# Patient Record
Sex: Female | Born: 1975
Health system: Southern US, Community
[De-identification: ages and names within clinical notes are randomized; demographics above are authoritative.]

## PROBLEM LIST (undated history)

## (undated) DIAGNOSIS — E785 Hyperlipidemia, unspecified: Secondary | ICD-10-CM

## (undated) DIAGNOSIS — I10 Essential (primary) hypertension: Secondary | ICD-10-CM

## (undated) HISTORY — DX: Hyperlipidemia, unspecified: E78.5

## (undated) HISTORY — DX: Essential (primary) hypertension: I10

---

## 2005-12-22 HISTORY — PX: REDUCTION MAMMAPLASTY: SUR839

## 2009-03-19 ENCOUNTER — Ambulatory Visit: Payer: Self-pay | Admitting: Family Medicine

## 2009-03-19 DIAGNOSIS — I10 Essential (primary) hypertension: Secondary | ICD-10-CM | POA: Insufficient documentation

## 2009-03-19 DIAGNOSIS — E7800 Pure hypercholesterolemia, unspecified: Secondary | ICD-10-CM | POA: Insufficient documentation

## 2009-03-19 DIAGNOSIS — G43909 Migraine, unspecified, not intractable, without status migrainosus: Secondary | ICD-10-CM | POA: Insufficient documentation

## 2009-09-17 ENCOUNTER — Ambulatory Visit: Payer: Self-pay | Admitting: Family Medicine

## 2009-09-17 DIAGNOSIS — F411 Generalized anxiety disorder: Secondary | ICD-10-CM | POA: Insufficient documentation

## 2009-09-18 ENCOUNTER — Encounter: Payer: Self-pay | Admitting: Family Medicine

## 2009-10-25 ENCOUNTER — Ambulatory Visit: Payer: Self-pay | Admitting: Family Medicine

## 2009-10-25 LAB — CONVERTED CEMR LAB
Cholesterol: 227 mg/dL — ABNORMAL HIGH (ref 0–200)
LDL Cholesterol: 147 mg/dL — ABNORMAL HIGH (ref 0–99)
VLDL: 18 mg/dL (ref 0–40)

## 2009-10-26 ENCOUNTER — Encounter: Payer: Self-pay | Admitting: Family Medicine

## 2009-10-26 LAB — CONVERTED CEMR LAB
BUN: 11 mg/dL (ref 6–23)
Chloride: 108 meq/L (ref 96–112)
Potassium: 4.1 meq/L (ref 3.5–5.3)

## 2009-10-29 ENCOUNTER — Encounter: Payer: Self-pay | Admitting: Family Medicine

## 2009-11-09 ENCOUNTER — Encounter: Payer: Self-pay | Admitting: Family Medicine

## 2009-11-29 ENCOUNTER — Encounter: Payer: Self-pay | Admitting: Family Medicine

## 2009-11-29 ENCOUNTER — Ambulatory Visit: Payer: Self-pay | Admitting: Family Medicine

## 2009-11-29 LAB — CONVERTED CEMR LAB
BUN: 10 mg/dL (ref 6–23)
Potassium: 3.6 meq/L (ref 3.5–5.3)
Sodium: 139 meq/L (ref 135–145)

## 2009-12-27 ENCOUNTER — Encounter: Payer: Self-pay | Admitting: Family Medicine

## 2010-02-06 ENCOUNTER — Encounter: Payer: Self-pay | Admitting: Family Medicine

## 2010-02-08 ENCOUNTER — Ambulatory Visit: Payer: Self-pay | Admitting: Family Medicine

## 2010-02-08 ENCOUNTER — Encounter: Payer: Self-pay | Admitting: Family Medicine

## 2010-02-08 LAB — CONVERTED CEMR LAB
CO2: 22 meq/L (ref 19–32)
Glucose, Bld: 80 mg/dL (ref 70–99)
Potassium: 3.7 meq/L (ref 3.5–5.3)
Sodium: 142 meq/L (ref 135–145)

## 2010-03-14 ENCOUNTER — Encounter: Payer: Self-pay | Admitting: Family Medicine

## 2010-05-30 ENCOUNTER — Emergency Department (HOSPITAL_COMMUNITY): Admission: EM | Admit: 2010-05-30 | Discharge: 2010-05-30 | Payer: Self-pay | Admitting: Family Medicine

## 2010-05-31 ENCOUNTER — Encounter: Payer: Self-pay | Admitting: Family Medicine

## 2010-06-10 ENCOUNTER — Ambulatory Visit: Payer: Self-pay | Admitting: Family Medicine

## 2010-06-10 DIAGNOSIS — R5383 Other fatigue: Secondary | ICD-10-CM

## 2010-06-10 DIAGNOSIS — R635 Abnormal weight gain: Secondary | ICD-10-CM | POA: Insufficient documentation

## 2010-06-10 DIAGNOSIS — R5381 Other malaise: Secondary | ICD-10-CM | POA: Insufficient documentation

## 2010-06-10 LAB — CONVERTED CEMR LAB
AST: 19 units/L (ref 0–37)
BUN: 16 mg/dL (ref 6–23)
CO2: 23 meq/L (ref 19–32)
Calcium: 9.5 mg/dL (ref 8.4–10.5)
Chloride: 104 meq/L (ref 96–112)
Creatinine, Ser: 0.98 mg/dL (ref 0.40–1.20)
Free T4: 1.09 ng/dL (ref 0.80–1.80)
Glucose, Bld: 93 mg/dL (ref 70–99)
HCT: 36.5 % (ref 36.0–46.0)
MCV: 77.2 fL — ABNORMAL LOW (ref 78.0–100.0)
Platelets: 312 10*3/uL (ref 150–400)
T3, Free: 2.4 pg/mL (ref 2.3–4.2)
TSH: 2.148 microintl units/mL (ref 0.350–4.500)
WBC: 9.1 10*3/uL (ref 4.0–10.5)

## 2010-06-13 ENCOUNTER — Encounter: Payer: Self-pay | Admitting: Family Medicine

## 2010-06-19 ENCOUNTER — Encounter: Payer: Self-pay | Admitting: Family Medicine

## 2010-06-19 ENCOUNTER — Ambulatory Visit: Payer: Self-pay | Admitting: Family Medicine

## 2010-06-19 LAB — CONVERTED CEMR LAB
Calcium: 9.7 mg/dL (ref 8.4–10.5)
Cholesterol: 230 mg/dL — ABNORMAL HIGH (ref 0–200)
HDL: 59 mg/dL (ref >39)
Sodium: 140 meq/L (ref 135–145)
Total CHOL/HDL Ratio: 3.9
Triglycerides: 100 mg/dL (ref <150)

## 2010-07-24 ENCOUNTER — Encounter: Payer: Self-pay | Admitting: Family Medicine

## 2010-10-29 ENCOUNTER — Encounter: Payer: Self-pay | Admitting: Family Medicine

## 2010-12-24 ENCOUNTER — Ambulatory Visit: Admission: RE | Admit: 2010-12-24 | Discharge: 2010-12-24 | Payer: Self-pay | Source: Home / Self Care

## 2011-01-21 NOTE — Miscellaneous (Signed)
Summary: HCTZ refill and dose correction  Clinical Lists Changes Received refill request via fax for HCTZ 12.5 ONCE a day.  Med list shows 2 tabs daily but last e refill was for once daily.  I corrected med list to reflect the dose she is taking. Medications: Changed medication from HYDROCHLOROTHIAZIDE 12.5 MG CAPS (HYDROCHLOROTHIAZIDE) 2 daily for blood pressure to HYDROCHLOROTHIAZIDE 12.5 MG CAPS (HYDROCHLOROTHIAZIDE) 1 daily for blood pressure - Signed Rx of HYDROCHLOROTHIAZIDE 12.5 MG CAPS (HYDROCHLOROTHIAZIDE) 1 daily for blood pressure;  #90 x 3;  Signed;  Entered by: Doralee Albino MD;  Authorized by: Doralee Albino MD;  Method used: Electronically to [facility]*, [address] Shipping/mailing, [place], [place]  [address], Ph: [phone], Fax: [phone]    Prescriptions: HYDROCHLOROTHIAZIDE 12.5 MG CAPS (HYDROCHLOROTHIAZIDE) 1 daily for blood pressure  #90 x 3   Entered and Authorized by:   Doralee Albino MD   Signed by:   Doralee Albino MD on 07/24/2010   Method used:   Electronically to        [facility]* (retail)       [address]       [address] Shipping/mailing       [place], [place]  [address]       Ph: [phone]       Fax: [phone]   RxID:   [id number]   Appended Document: HCTZ refill and dose correction Change that.  Spoke to patient and she takes 25 mg daily.  OK to use tabs.  New Rx sent.  Doralee Albino MD  July 24, 2010 12:16 PM    Clinical Lists Changes  Medications: Changed medication from HYDROCHLOROTHIAZIDE 12.5 MG CAPS (HYDROCHLOROTHIAZIDE) 1 daily for blood pressure to HYDROCHLOROTHIAZIDE 25 MG  TABS (HYDROCHLOROTHIAZIDE) Take 1 tab by mouth every morning - Signed Rx of HYDROCHLOROTHIAZIDE 25 MG  TABS (HYDROCHLOROTHIAZIDE) Take 1 tab by mouth every morning;  #90 x 3;  Signed;  Entered by: Doralee Albino MD;  Authorized by: Doralee Albino MD;  Method used: Electronically to [facility]*, [address] Shipping/mailing, [place], [place]  [address], Ph: [phone], Fax: [phone]    Prescriptions: HYDROCHLOROTHIAZIDE 25 MG  TABS (HYDROCHLOROTHIAZIDE) Take 1 tab by mouth every morning  #90 x 3   Entered and Authorized by:   Doralee Albino MD   Signed by:   Doralee Albino MD on 07/24/2010   Method used:   Electronically to        [facility]* (retail)       [address]       [address] Shipping/mailing       [place], [place]  [address]       Ph: [phone]       Fax: [phone]   RxID:   [id number]

## 2011-01-21 NOTE — Miscellaneous (Signed)
  Clinical Lists Changes  Medications: Added new medication of TESSALON PERLES 100 MG  CAPS (BENZONATATE) 1 by mouth at bedtime for cough - Signed Rx of TESSALON PERLES 100 MG  CAPS (BENZONATATE) 1 by mouth at bedtime for cough;  #15 x 0;  Signed;  Entered by: Pearlean Brownie MD;  Authorized by: Pearlean Brownie MD;  Method used: Electronically to [facility]*, [address] Shipping/mailing, [place], [place]  [address], Ph: [phone], Fax: [phone] <no value>    Prescriptions: TESSALON PERLES 100 MG  CAPS (BENZONATATE) 1 by mouth at bedtime for cough  #15 x 0   Entered and Authorized by:   Pearlean Brownie MD   Signed by:   Pearlean Brownie MD on 06/13/2010   Method used:   Electronically to        [facility]* (retail)       [address]       [address] Shipping/mailing       [place], [place]  [address]       Ph: [phone]       Fax: [phone]   RxID:   [id number]

## 2011-01-21 NOTE — Miscellaneous (Signed)
Summary: Change from setraline to citalopram  Medications: Removed medication of ZOLOFT 50 MG TABS (SERTRALINE HCL) 1 by mouth in the AM Added new medication of CITALOPRAM HYDROBROMIDE 20 MG TABS (CITALOPRAM HYDROBROMIDE) 1 daily - Signed Rx of CITALOPRAM HYDROBROMIDE 20 MG TABS (CITALOPRAM HYDROBROMIDE) 1 daily;  #30 x 3;  Signed;  Entered by: Pearlean Brownie MD;  Authorized by: Pearlean Brownie MD;  Method used: Electronically to [facility]*, [address] Shipping/mailing, [place], [place]  [address], Ph: [phone], Fax: [phone] Clinical Lists Changes  Medications: Removed medication of ZOLOFT 50 MG TABS (SERTRALINE HCL) 1 by mouth in the AM Added new medication of CITALOPRAM HYDROBROMIDE 20 MG TABS (CITALOPRAM HYDROBROMIDE) 1 daily Rx of CITALOPRAM HYDROBROMIDE 20 MG TABS (CITALOPRAM HYDROBROMIDE) 1 daily;  #30 x 3;  Unsigned;  Entered by: Pearlean Brownie MD;  Authorized by: Pearlean Brownie MD;  Method used: Electronically to [facility]*, [address] Shipping/mailing, [place], [place]  [address], Ph: [phone], Fax: [phone]  I sent it in.  It gave a warning about reactions to folks with red dye allergy which was triggered by your prior allergy to Amoxicillin.  I think its fine to take as long as you aren't allergic to red dye  Let me know if any problems  Linda Gordon  From: Linda Gordon  Sent: Thursday, March 14, 2010 9:23 AM To: Linda Gordon Subject: RE: Question about medication  I would greatly appreciate it. Thank you so much!!!   Patina  ________________________________________ From: Linda Gordon Sent: Wed 03/13/2010 3:16 PM To: Linda Gordon Subject: RE: Question about medication  I think citalopram would be a fine choice.  We'll just hope for you the yawning is only with Sertraline.   Would you like me to send in Citalopram?   I can do it in the AM as I'm out of the  office right now Nedra Hai  -----Original Message-----  From: Linda Gordon  Sent: Wed 03/13/2010 2:40 PM  To: Linda Gordon  Subject: RE: Question about medication     Hi Dr Deirdre Priest,     I would be the one who would experience this. I have tried Paxil in the past and it did not work for me at all. I don't think I have issues with anxiety as much as I did when I came in to visit you. I'm more concerned about my mood which a lot of times before I started Sertraline was very bad. I know the incidence of Citalopram causing yawning is also 1%...what do you think about that medication? Thank you so much for taking the time to respond!  Linda Gordon  ________________________________  From: Linda Gordon  Sent: Wed 03/13/2010 9:00 AM  To: Linda Gordon  Subject: RE: Question about medication   I have not had pts complain of yawning with sertraline but looked it up and it certainly has been reported.  Paroxetine (Paxil) is also indicated for anxiety and would be my recommendation as an alternative.  If the yawning does not improve we could change to a completely different class likes SNRIs but these have less experience regarding anxiety treatment. Would you like me to send in Paxil?  Linda Gordon   From: Linda Gordon  Sent: Tuesday, March 12, 2010 10:39 AM  To: Linda Gordon  Subject: Question about medication  Hello Dr Deirdre Priest,  I hope all is well and you are having a great week. I have a question for  you concerning my Sertraline. I am experiencing excessive yawning to the point of annoyance. I can be at the gym and I'm yawning and it's not one here and there they are pretty much back to back. I get plenty of rest because I haven't had issues with sleeping since I started working out. I looked up information on excessive yawning and realized that my medication could be the culprit. I still fell that I need to be on something and was inquiring if there are any alternatives.  Thanks in advance,   Linda Gordon  [phone]  Prescriptions: CITALOPRAM HYDROBROMIDE 20 MG TABS (CITALOPRAM HYDROBROMIDE) 1 daily  #30 x 3   Entered and Authorized by:   Pearlean Brownie MD   Signed by:   Pearlean Brownie MD on 03/14/2010   Method used:   Electronically to        [facility]* (retail)       [address]       [address] Shipping/mailing       [place], [place]  [address]       Ph: [phone]       Fax: [phone]   RxID:   [id number]

## 2011-01-21 NOTE — Miscellaneous (Signed)
Summary: email message  Clinical Lists Changes  Medications: Changed medication from ENALAPRIL MALEATE 20 MG TABS (ENALAPRIL MALEATE) 1 by mouth daily for blood pressure to ENALAPRIL MALEATE 20 MG TABS (ENALAPRIL MALEATE) 1 by mouth twice daily for blood pressure I did have a good holiday season.I hope your [place] is off to a good start other thanthe BP.  Usually would go up to 25 mg on your HCTZ but with your history of low potassium I think it would be better to go up to 40 mg a day of enalapril.  You can take 2 pills at once or twice a day which might be a little better but harder to remember.Let me know how your bp is doing over the next week or so.We will need to check your bmet in a few weeks of the new dose.  Call if any questions  LC  From: [name] Sent: Thursday, December 27, 2009 12:48 PMTo: Deirdre Priest, LeeSubject: Blood pressure  Hello and Happy New Year,  I'm sending this in regards to my blood pressure since the addition of HCTZ. My bp was good in the beginning but in the last couple of weeks it's back to where it was before we added the new medication. It is now running anywhere from 140/90 to 154/106. At this point I don't know if you want to increase the Enalapril or change my medication altogether. If you could please give me your thoughts on this I'm just really concerned because I am on birth control pills.  Thanks and have a great day,  [employer] [phone]

## 2011-01-21 NOTE — Miscellaneous (Signed)
Summary: went to ED  Clinical Lists Changes went to ED for cough. dx bronchitis...no answer at home phone. called at work #. She is off until monday. will try monday & offer f/u appt.Golden Circle RN  May 31, 2010 8:45 AM

## 2011-01-21 NOTE — Miscellaneous (Signed)
  Clinical Lists Changes  Orders: Added new Test order of Basic Met-[facility] ([id number]) - Signed 

## 2011-01-21 NOTE — Miscellaneous (Signed)
Summary: Email  Clinical Lists Changes Hi Dr. Deirdre Priest,   I hope all has been well with you. I am emailing you because Linda Gordon has caused me to be concerned about taking my ace inhibitor. I have been experiencing a hacking cough off and on for about 6 months. Most of the time I experience the cough without any other symptoms involved. I was just wondering what your thoughts were and if you think changing my medication is a good idea. If you do decide to change my medication can we please stick to a medication that is generically available.   Thanks,   Linda Gordon [phone]   Sorry for the delay.  Certainly the acei could cause a cough.  We can change to losartan and see if it goes away.   I'll send a Rx to the [facility] pharmacy.  Monitor your bp and let me know how it goes  [employer]  Medications: Changed medication from ENALAPRIL MALEATE 20 MG TABS (ENALAPRIL MALEATE) 1 by mouth twice daily for blood pressure to LOSARTAN POTASSIUM 50 MG TABS (LOSARTAN POTASSIUM) 1 daily for blood pressure - Signed Rx of LOSARTAN POTASSIUM 50 MG TABS (LOSARTAN POTASSIUM) 1 daily for blood pressure;  #30 x 1;  Signed;  Entered by: Pearlean Brownie MD;  Authorized by: Pearlean Brownie MD;  Method used: Electronically to [facility]*, [address] Shipping/mailing, [place], [place]  [address], Ph: [phone], Fax: [phone]    [Prescriptions]

## 2011-01-21 NOTE — Assessment & Plan Note (Signed)
Summary: cpe,df   Vital Signs:  Patient profile:   35 year old female Height:      65 inches Weight:      199.8 pounds BMI:     33.37 Temp:     98.2 degrees F oral Pulse rate:   74 / minute BP sitting:   120 / 84  (left arm) Cuff size:   regular  Vitals Entered By: Garen Grams LPN (June 10, 2010 4:01 PM)  Serial Vital Signs/Assessments:  Time      Position  BP       Pulse  Resp  Temp     By 4:55pm              134/88                         Terese Door  CC: cpe Is Patient Diabetic? No Pain Assessment Patient in pain? no        CC:  cpe.  History of Present Illness: Fatigue feels tired most of the time even though exercising and eating well.  Discouraged about not losing any weight.  No fever or chills or hair changes or productive cough or chest pain or shortness of breath or edema  Cough post brochitis dry cough esp at night  Anxiety - doing well with celexa  HYPERTENSION Disease Monitoring   Blood pressure range:higher at home 140s/90s       Chest pain: N     Dyspnea:N Medications   Compliance: daily   Lightheadedness: N     Edema:N Prevention   Exercise: yes 5 days a week    Salt restriction:Yes  ROS - as above PMH - Medications reviewed and updated in medication list.  Smoking Status noted in VS form      Habits & Providers  Alcohol-Tobacco-Diet     Tobacco Status: never  Current Medications (verified): 1)  Enalapril Maleate 20 Mg Tabs (Enalapril Maleate) .Marland Kitchen.. 1 By Mouth Twice Daily For Blood Pressure 2)  Lutera 0.1-20 Mg-Mcg Tabs (Levonorgestrel-Ethinyl Estrad) .Marland Kitchen.. 1 Tablet By Mouth Daily 3)  Pravachol 20 Mg Tabs (Pravastatin Sodium) .Marland Kitchen.. 1 Daily 4)  Relpax 40 Mg Tabs (Eletriptan Hydrobromide) .... As Directed For Migraine Headache 5)  Hydrochlorothiazide 12.5 Mg Caps (Hydrochlorothiazide) .... 2 Daily For Blood Pressure 6)  Citalopram Hydrobromide 20 Mg Tabs (Citalopram Hydrobromide) .Marland Kitchen.. 1 Daily  Allergies: 1)  ! Penicillin 2)  !  Sulfa 3)  ! Flagyl 4)  ! Fluconazole (Fluconazole) 5)  ! Amoxicillin  Physical Exam  General:  Well-developed,well-nourished,in no acute distress; alert,appropriate and cooperative throughout examination Neck:  No deformities, masses, or tenderness noted. Lungs:  Normal respiratory effort, chest expands symmetrically. Lungs are clear to auscultation, no crackles or wheezes. Heart:  Normal rate and regular rhythm. S1 and S2 normal without gallop, murmur, click, rub or other extra sounds. Abdomen:  Bowel sounds positive,abdomen soft and non-tender without masses, organomegaly or hernias noted. Msk:  No deformity or scoliosis noted of thoracic or lumbar spine.   Extremities:  No clubbing, cyanosis, edema, or deformity noted with normal full range of motion of all joints.     Impression & Recommendations:  Problem # 1:  FATIGUE (ICD-780.79) may be more discouragement because not losing weight despite significant exercise and diet changes.  Does not appear to be depression or any cancer or inflamatory disease.  Will check labs  Orders: CBC-[facility] ([address]) TSH-[facility] ([id number]) Comp Met-[facility] [phone]) Sed Rate (ESR)-[facility] (  [address]) Free T3-[facility] [phone]) Free T4-[facility] [phone])  Problem # 2:  HYPERTENSION (ICD-401.9) Not controlled by home readings.  Increase hctz and watch her k closely  Her updated medication list for this problem includes:    Enalapril Maleate 20 Mg Tabs (Enalapril maleate) .Marland Kitchen... 1 by mouth twice daily for blood pressure    Hydrochlorothiazide 12.5 Mg Caps (Hydrochlorothiazide) .Marland Kitchen... 2 daily for blood pressure  Future Orders: Basic Met-[facility] ([id number]) ... 05/29/2011  Problem # 3:  HYPERCHOLESTEROLEMIA (ICD-272.0) check fastling  Her updated medication list for this problem includes:    Pravachol 20 Mg Tabs (Pravastatin sodium) .Marland Kitchen... 1 daily  Future Orders: Lipid-[facility] ([id number]) ... 05/29/2011  Problem # 4:  Preventive Health Care (ICD-V70.0) Up to Date.   Sees Dr Renaldo Fiddler for paps   Complete Medication List: 1)  Enalapril Maleate 20 Mg Tabs (Enalapril maleate) .Marland Kitchen.. 1 by mouth twice daily for blood pressure 2)  Lutera 0.1-20 Mg-mcg Tabs (Levonorgestrel-ethinyl estrad) .Marland Kitchen.. 1 tablet by mouth daily 3)  Pravachol 20 Mg Tabs (Pravastatin sodium) .Marland Kitchen.. 1 daily 4)  Relpax 40 Mg Tabs (Eletriptan hydrobromide) .... As directed for migraine headache 5)  Hydrochlorothiazide 12.5 Mg Caps (Hydrochlorothiazide) .... 2 daily for blood pressure 6)  Citalopram Hydrobromide 20 Mg Tabs (Citalopram hydrobromide) .Marland Kitchen.. 1 daily  Other Orders: [facility] - Est  18-39 yrs [id number])  Patient Instructions: 1)  Increase your HCTZ to 2 daily 2)  Follow your blood pressure and right down your readings 3)  I will call you if your lab is abnormal otherwise I will send you a email within 2 weeks. 4)  Come in for a fasting blood test in one week Prescriptions: CITALOPRAM HYDROBROMIDE 20 MG TABS (CITALOPRAM HYDROBROMIDE) 1 daily  #90 x 3   Entered and Authorized by:   Pearlean Brownie MD   Signed by:   Pearlean Brownie MD on 06/10/2010   Method used:   Electronically to        [facility]* (retail)       [address]       [address] Shipping/mailing       [place], [place]  [address]       Ph: [phone]       Fax: [phone]   RxID:   [id number] PRAVACHOL 20 MG TABS (PRAVASTATIN SODIUM) 1 daily  #90 x 3   Entered and Authorized by:   Pearlean Brownie MD   Signed by:   Pearlean Brownie MD on 06/10/2010   Method used:   Electronically to        [facility]* (retail)       [address]       [address] Shipping/mailing       [place], [place]  [address]       Ph: [phone]       Fax: [phone]   RxID:   [id number]   Prevention & Chronic Care Immunizations   Influenza vaccine: Historical  (09/21/2009)    Tetanus booster: Not documented    Pneumococcal vaccine: Not documented  Other Screening    Pap smear: Dr Vickey Sages nl   (10/18/2009)   Smoking status: never  (06/10/2010)  Lipids   Total Cholesterol: 227  (10/25/2009)   LDL: 147  (10/25/2009)   LDL Direct: Not documented   HDL: 62  (10/25/2009)   Triglycerides: 91  (10/25/2009)    SGOT (AST): 15  (03/01/2009)   SGPT (ALT): 10  (03/01/2009) CMP ordered  Alkaline phosphatase: 70  (03/01/2009)   Total bilirubin: 0.5  (03/01/2009)  Hypertension   Last Blood Pressure: 120 / 84  (06/10/2010)   Serum creatinine: 0.80  (02/08/2010)   Serum potassium 3.7  (02/08/2010) CMP ordered   Self-Management Support :   Personal Goals (by the next clinic visit) :      Personal blood pressure goal: 140/90  (10/25/2009)     Personal LDL goal: 130  (10/25/2009)    Hypertension self-management support: BP self-monitoring log, Written self-care plan  (10/25/2009)    Lipid self-management support: Not documented

## 2011-01-23 NOTE — Assessment & Plan Note (Signed)
Summary: new pt/nutrition consult/bmc   Vital Signs:  Patient profile:   35 year old female Height:      64 inches Weight:      202.3 pounds BMI:     34.85  Vitals Entered By: Wyona Almas PHD (December 24, 2010 4:00 PM)  History of Present Illness: Assessment:  Spent 60 min w/ pt.  Usual eating pattern includes 3 meals and 2 snacks/day.  Everyday foods/beverages include 2 boiled eggs w/ white toast & apple butter or cooked oatmeal, 1 c juice.  Usual exercise routine includes 45 min cardio, plus wts at the gym 3-4 X wk.   24-hr recall was not a typical day: B (11 AM)- 2 scrmbld eggs w/ 1 slc Amer chs, 2 slc white toast w/ 2 tsp apple butter, 3 strips bacon, 1 c green tea gingerale; Snk- water; D (6:45 PM)- broccoli, 4 oz crockpot chx breast (crm of mushrm soup), 1 c pasta, 1 cc cookie.  A typical work day is boiled egg/oatmeal for B; L is frozen meal, sometimes a salad too or fruit; D is usually chx, veg, starch.  BP has been med-controlled for 11 yrs, since pre-eclampsia w/ her daughter.    Nutrition Diagnosis:  Physical inactivity (NB-2.1) related to time constraints as evidenced by exercise 3-4 X wk.  Inappropriate intake of types of carbohydrate (NI-53.3) related to veg's as evidenced by 1 serving of veg's limited to 1-2 X day.    Intervention: See Patient Instructions.    Monitoring/Eval:  Dietary intake, body weight, and exercise at 3-4-wk F/U.    Allergies: 1)  ! Penicillin 2)  ! Sulfa 3)  ! Flagyl 4)  ! Fluconazole (Fluconazole) 5)  ! Amoxicillin   Complete Medication List: 1)  Losartan Potassium 50 Mg Tabs (Losartan potassium) .Marland Kitchen.. 1 daily for blood pressure 2)  Lutera 0.1-20 Mg-mcg Tabs (Levonorgestrel-ethinyl estrad) .Marland Kitchen.. 1 tablet by mouth daily 3)  Pravachol 20 Mg Tabs (Pravastatin sodium) .Marland Kitchen.. 1 daily 4)  Relpax 40 Mg Tabs (Eletriptan hydrobromide) .... As directed for migraine headache 5)  Hydrochlorothiazide 25 Mg Tabs (Hydrochlorothiazide) .... Take 1 tab by mouth  every morning 6)  Citalopram Hydrobromide 20 Mg Tabs (Citalopram hydrobromide) .Marland Kitchen.. 1 daily 7)  Tessalon Perles 100 Mg Caps (Benzonatate) .Marland Kitchen.. 1 by mouth at bedtime for cough  Other Orders: Inital Assessment Each - [facility] ([address])  Patient Instructions: 1)  No more than 5 hours between eating.  2)  Obtain twice as many veg's as protein or carbohydrate foods for both lunch and dinner.  3)  Physical activity: Continue gym 3-4 X wk.  Add to this some exercises at home as possible AND exercise opportunities each day, i.e., stairs vs. elevator, park far away.   4)  Make a list of ways you can increase your day-to-day activity.   5)  Experiment with NO juice in the mornings.  Track the time at which you first notice hunger for 1-2 wks.   6)  Avoid other simple carb's before noon.  Eat your fruit instead of drinking it.   7)  Follow-up appt:  Please schedule an appt in Feb.     Orders Added: 1)  Inital Assessment Each - [facility] [[id number]]

## 2011-02-14 ENCOUNTER — Ambulatory Visit: Payer: Self-pay

## 2011-02-14 ENCOUNTER — Other Ambulatory Visit: Payer: Self-pay | Admitting: Occupational Medicine

## 2011-02-14 DIAGNOSIS — R7611 Nonspecific reaction to tuberculin skin test without active tuberculosis: Secondary | ICD-10-CM

## 2011-02-21 ENCOUNTER — Other Ambulatory Visit: Payer: Self-pay | Admitting: Family Medicine

## 2011-02-21 DIAGNOSIS — R7611 Nonspecific reaction to tuberculin skin test without active tuberculosis: Secondary | ICD-10-CM

## 2011-02-21 NOTE — Assessment & Plan Note (Signed)
No signs of TB.  Will refer to ID for further investigation or testing as needed

## 2011-02-26 ENCOUNTER — Telehealth: Payer: Self-pay | Admitting: Family Medicine

## 2011-02-26 NOTE — Telephone Encounter (Signed)
Phone note complete.  See work queue for details. Linda Gordon, Linda Gordon

## 2011-02-26 NOTE — Telephone Encounter (Signed)
Pt is asking about referral to specialist - states that someone called yesterday, but didn't leave a message.

## 2011-03-05 ENCOUNTER — Ambulatory Visit (INDEPENDENT_AMBULATORY_CARE_PROVIDER_SITE_OTHER): Payer: Self-pay | Admitting: Family Medicine

## 2011-03-05 VITALS — BP 141/94 | HR 83 | Temp 98.3°F | Ht 64.0 in | Wt 196.3 lb

## 2011-03-05 DIAGNOSIS — L089 Local infection of the skin and subcutaneous tissue, unspecified: Secondary | ICD-10-CM

## 2011-03-05 MED ORDER — DOXYCYCLINE HYCLATE 100 MG PO TABS: 100.0000 mg | ORAL_TABLET | Freq: Two times a day (BID) | ORAL | Status: AC

## 2011-03-05 NOTE — Assessment & Plan Note (Signed)
  Pustule suspicious for MRSA infection. Will culture and start on Doxycycline x 10 days.  Will call patient with results.

## 2011-03-05 NOTE — Patient Instructions (Addendum)
We are testing your wound for MRSA with a wound culture.  I will have my staff call you with results.  Start Doxycycline and finish the full 10 days.  Look into Hibiclens baths if you develop more of these and this is MRSA.  You can use Bacitracin on the skin.  You can also look up charcoal compresses on the Internet as a way to treat the wound directly.

## 2011-03-05 NOTE — Progress Notes (Signed)
Skin Pustule: Pt has a Rt lower leg that has a pustule on the lateral portion of her calf. She started feeling it on Sat and says it has gradually worsened over the last 4 days. She says it does not itch like her usual bug bites do. She has no burning but does have pain when she is walking. She went over to the sports medicine clinic to ask them if she should be evaluated and she was told to come over here. She has a prior history of getting lots of bug bites but no MRSA infections or boils. She was running on Saturday and isn't sure if she got bit or scratched by something.   ROS:  No fevers or chills, some pain and erythema around the wound site. Otherwise neg exam.   PE: GEN: sitting comfortably in chair.  Ext: Pt has a area of erythema measuring 2-2.5 cm in diameter. She has a small 4 mm pustule in the center of the erythema. Very little induration. Warmth is present, minor swelling present as well.

## 2011-03-06 ENCOUNTER — Encounter: Payer: Self-pay | Admitting: Family Medicine

## 2011-03-08 LAB — WOUND CULTURE: Gram Stain: NONE SEEN

## 2011-03-11 ENCOUNTER — Telehealth: Payer: Self-pay | Admitting: Family Medicine

## 2011-03-11 NOTE — Telephone Encounter (Signed)
Pt wants to know if results have come back for MRSA?

## 2011-03-12 NOTE — Telephone Encounter (Signed)
Called and informed pt of neg lab and told her to continue taking the abx.Linda Gordon [place]

## 2011-03-24 ENCOUNTER — Encounter: Payer: Self-pay | Admitting: Infectious Diseases

## 2011-03-24 ENCOUNTER — Ambulatory Visit (INDEPENDENT_AMBULATORY_CARE_PROVIDER_SITE_OTHER): Payer: Self-pay | Admitting: Infectious Diseases

## 2011-03-24 DIAGNOSIS — R7611 Nonspecific reaction to tuberculin skin test without active tuberculosis: Secondary | ICD-10-CM

## 2011-03-24 NOTE — Progress Notes (Signed)
  Subjective:    Patient ID: Linda Gordon, female    DOB: 07/07/1976, 35 y.o.   MRN: [id number]  HPI 35 yo F with hx of MSSA skin infection (03-05-11) and recent PPD+. This was followed by a Quantiferon Gold that was positive as well.  She had an erythematous skin reaction each time she had a skin test for the last 3 years but no induration. It was very pruritic and took about 2 weeks to go away. Has had negative CXR at [facility]. Has had yearly HIV-.  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 No family hx.    Review of Systems  Constitutional: Negative for fever, chills and unexpected weight change.  Respiratory: Negative for cough.   Gastrointestinal: Negative for diarrhea and constipation.  Hematological: Negative for adenopathy.       Objective:   Physical Exam  Constitutional: She appears well-developed and well-nourished. No distress.  Eyes: EOM are normal. Pupils are equal, round, and reactive to light.  Neck: Neck supple.  Cardiovascular: Normal rate, regular rhythm and normal heart sounds.   Pulmonary/Chest: Effort normal and breath sounds normal.  Abdominal: Soft. Bowel sounds are normal.  Musculoskeletal:       Right shoulder: She exhibits no swelling.          Assessment & Plan:

## 2011-03-24 NOTE — Assessment & Plan Note (Signed)
Spoke with pt about PPD and quantiferon gold. The sensitivity of this test is ~90%, specificity is higher. Given her history of a positive test and indeterminate PPDs, will refer her to health dept for treatment. We discussed this, that she cannot drink ETOH while on treatment and that HD may in fact retest her PPD and decide not to treat her if they feel it is negative.  I suspect she will need treatment. She will be seen today at 3pm. She will refer to ID clinic prn.

## 2011-03-27 ENCOUNTER — Telehealth: Payer: Self-pay | Admitting: Family Medicine

## 2011-03-27 NOTE — Telephone Encounter (Signed)
Spoke with her.  Basically agreed there were pros and cons to either decision and I feel either course is justifiable, given that her likelihood of this being a false positive is very real since her pretest probability is quite low with no known close exposure.    From: [name]  Sent: Wednesday, March 26, 2011 11:47 AM To: [name] Subject: Hello  Hello Dr. Deirdre Priest,   I was hoping that we could talk when you get a chance. I had my appointment with Dr. Ninetta Lights and he recommends medication. I know that I have the option to not take it and after doing some research of my own and talking to several people I don't think taking medication is what I want to do. I just would like your opinion on it. I will be at work until 5:30 today and off the rest of the week. If possible please give me a call either at work, home, or on my cell.   Thanks in Linda, Gordon [phone] home [phone] cell

## 2011-06-10 ENCOUNTER — Other Ambulatory Visit: Payer: Self-pay | Admitting: Family Medicine

## 2011-06-10 DIAGNOSIS — E7800 Pure hypercholesterolemia, unspecified: Secondary | ICD-10-CM

## 2011-06-10 DIAGNOSIS — I10 Essential (primary) hypertension: Secondary | ICD-10-CM

## 2011-06-10 DIAGNOSIS — R5381 Other malaise: Secondary | ICD-10-CM

## 2011-06-10 DIAGNOSIS — R5383 Other fatigue: Secondary | ICD-10-CM

## 2011-06-19 ENCOUNTER — Other Ambulatory Visit: Payer: Self-pay

## 2011-06-19 DIAGNOSIS — I10 Essential (primary) hypertension: Secondary | ICD-10-CM

## 2011-06-19 DIAGNOSIS — R5381 Other malaise: Secondary | ICD-10-CM

## 2011-06-19 DIAGNOSIS — E7800 Pure hypercholesterolemia, unspecified: Secondary | ICD-10-CM

## 2011-06-19 LAB — COMPREHENSIVE METABOLIC PANEL
ALT: 17 U/L (ref 0–35)
AST: 21 U/L (ref 0–37)
Creat: 0.97 mg/dL (ref 0.50–1.10)
Sodium: 141 mEq/L (ref 135–145)
Total Bilirubin: 0.4 mg/dL (ref 0.3–1.2)
Total Protein: 7.1 g/dL (ref 6.0–8.3)

## 2011-06-19 LAB — LIPID PANEL
LDL Cholesterol: 171 mg/dL — ABNORMAL HIGH (ref 0–99)
Total CHOL/HDL Ratio: 4.2 Ratio
VLDL: 19 mg/dL (ref 0–40)

## 2011-06-19 NOTE — Progress Notes (Signed)
Cbc,flp,vit d and cmp done today Torian Thoennes

## 2011-06-20 LAB — CBC
MCH: 26.9 pg (ref 26.0–34.0)
MCV: 79.5 fL (ref 78.0–100.0)
Platelets: 267 10*3/uL (ref 150–400)
RBC: 4.69 MIL/uL (ref 3.87–5.11)
RDW: 14.8 % (ref 11.5–15.5)
WBC: 5.7 10*3/uL (ref 4.0–10.5)

## 2011-06-30 ENCOUNTER — Encounter: Payer: Self-pay | Admitting: Infectious Diseases

## 2011-06-30 ENCOUNTER — Encounter: Payer: Self-pay | Admitting: Family Medicine

## 2011-06-30 ENCOUNTER — Ambulatory Visit (INDEPENDENT_AMBULATORY_CARE_PROVIDER_SITE_OTHER): Payer: Commercial Managed Care - PPO | Admitting: Family Medicine

## 2011-06-30 DIAGNOSIS — E7800 Pure hypercholesterolemia, unspecified: Secondary | ICD-10-CM

## 2011-06-30 DIAGNOSIS — F411 Generalized anxiety disorder: Secondary | ICD-10-CM

## 2011-06-30 DIAGNOSIS — I10 Essential (primary) hypertension: Secondary | ICD-10-CM

## 2011-06-30 MED ORDER — CITALOPRAM HYDROBROMIDE 20 MG PO TABS: 30.0000 mg | ORAL_TABLET | Freq: Every day | ORAL | Status: DC

## 2011-06-30 MED ORDER — HYDROCHLOROTHIAZIDE 12.5 MG PO CAPS: 12.5000 mg | ORAL_CAPSULE | Freq: Every day | ORAL | Status: DC

## 2011-06-30 NOTE — Assessment & Plan Note (Signed)
Good control. Will decrease her HCTZ and follow closely

## 2011-06-30 NOTE — Patient Instructions (Signed)
Congratulations on all the great changes  Email if your blood pressure is not controlled < 140/90  We should recheck your cholesterol in 1 year

## 2011-06-30 NOTE — Progress Notes (Signed)
  Subjective:    Patient ID: Linda Gordon, female    DOB: 04-04-1976, 35 y.o.   MRN: [id number]  HPI  Feels well   HYPERTENSION Disease Monitoring Blood pressure range-usually in 110/70s at the pharmacy Chest pain- no      Dyspnea- no Medications Compliance- daily Lightheadedness- no   Edema- no  HYPERLIPIDEMIA Symptoms Chest pain on exertion:  no   Leg claudication:   no Medications: Compliance- not taking pravachol for > 6 monthts Right upper quadrant pain- no  Muscle aches- no  Anxiety Is better with her regular exercise.  Increased citalopram to 30 mg on her own.   Weathered her brothers recent death fairly well    Review of Systems  Patient reports no  vision/ hearing changes,anorexia, weight change, fever ,adenopathy, persistant / recurrent hoarseness, swallowing issues, chest pain, edema,persistant / recurrent cough, hemoptysis, dyspnea(rest, exertional, paroxysmal nocturnal), gastrointestinal  bleeding (melena, rectal bleeding), abdominal pain, excessive heart burn, GU symptoms(dysuria, hematuria, pyuria, voiding/incontinence  Issues) syncope, focal weakness, severe memory loss, concerning skin lesions, depression, anxiety, abnormal bruising/bleeding, major joint swelling, .       Objective:   Physical Exam     Heart - Regular rate and rhythm.  No murmurs, gallops or rubs.    Lungs:  Normal respiratory effort, chest expands symmetrically. Lungs are clear to auscultation, no crackles or wheezes. Extremities:  No cyanosis, edema, or deformity noted with good range of motion of all major joints.   Neck:  No deformities, thyromegaly, masses, or tenderness noted.   Supple with full range of motion without pain. Skin:  Intact without suspicious lesions or rashes    Assessment & Plan:

## 2011-06-30 NOTE — Assessment & Plan Note (Signed)
Stable on current dose of celexa and regular vigorous exercise

## 2011-06-30 NOTE — Assessment & Plan Note (Signed)
She is exercising well and eating well.  Discussed pros and cons of statin use.  She would like to follow LDL without pravachol for now.  I think this is a reasonable choice

## 2011-07-08 ENCOUNTER — Telehealth: Payer: Self-pay | Admitting: Family Medicine

## 2011-07-08 NOTE — Telephone Encounter (Signed)
The fluid retention is probably a combination of coming off of the HCTZ and the hot weather.   I would slowly wean off the HCTZ starting with every other day and slowly going down until you can come off all together which might take 1-2 months.  Cut back on salt to a reasonable degree  Good work on the BP!!  Let me know if any problems  From: [name]  Sent: Tuesday, July 08, 2011 1:58 PM To: [name] Subject: Fluid retention  Hello again,   After my appointment the other week I decided to discontinue the HCTZ and monitor my bp since that was a possible option. I'm not completely against taking it daily but my goal is to get completely off of medication. It has actually been really good 120's over 80's range. My only issue is that with me drinking the amount of water that I do I am now retaining fluid. The other day I took my HCTZ and dropped 4 lbs in less than 24 hours. Is there a natural alternative to taking my medication or should I take it a few days a week to control the fluid? Any input would be greatly appreciated!!!!   Thanks,   Linda Gordon   Blood pressure readings   07-02-11 127/88 07-03-11 121/87 07-07-11 119/83 07-08-11 130/82

## 2011-10-20 ENCOUNTER — Other Ambulatory Visit: Payer: Self-pay | Admitting: Family Medicine

## 2011-10-20 MED ORDER — LOSARTAN POTASSIUM 50 MG PO TABS: 50.0000 mg | ORAL_TABLET | Freq: Every day | ORAL | Status: DC

## 2011-12-27 IMAGING — CR DG CHEST 2V
2 series · 2 of 2 positions shown · non-contrast
Comparison: None.

CLINICAL DATA: Positive TB test

CHEST - 2 VIEW

[view not recorded (1 of 2)]
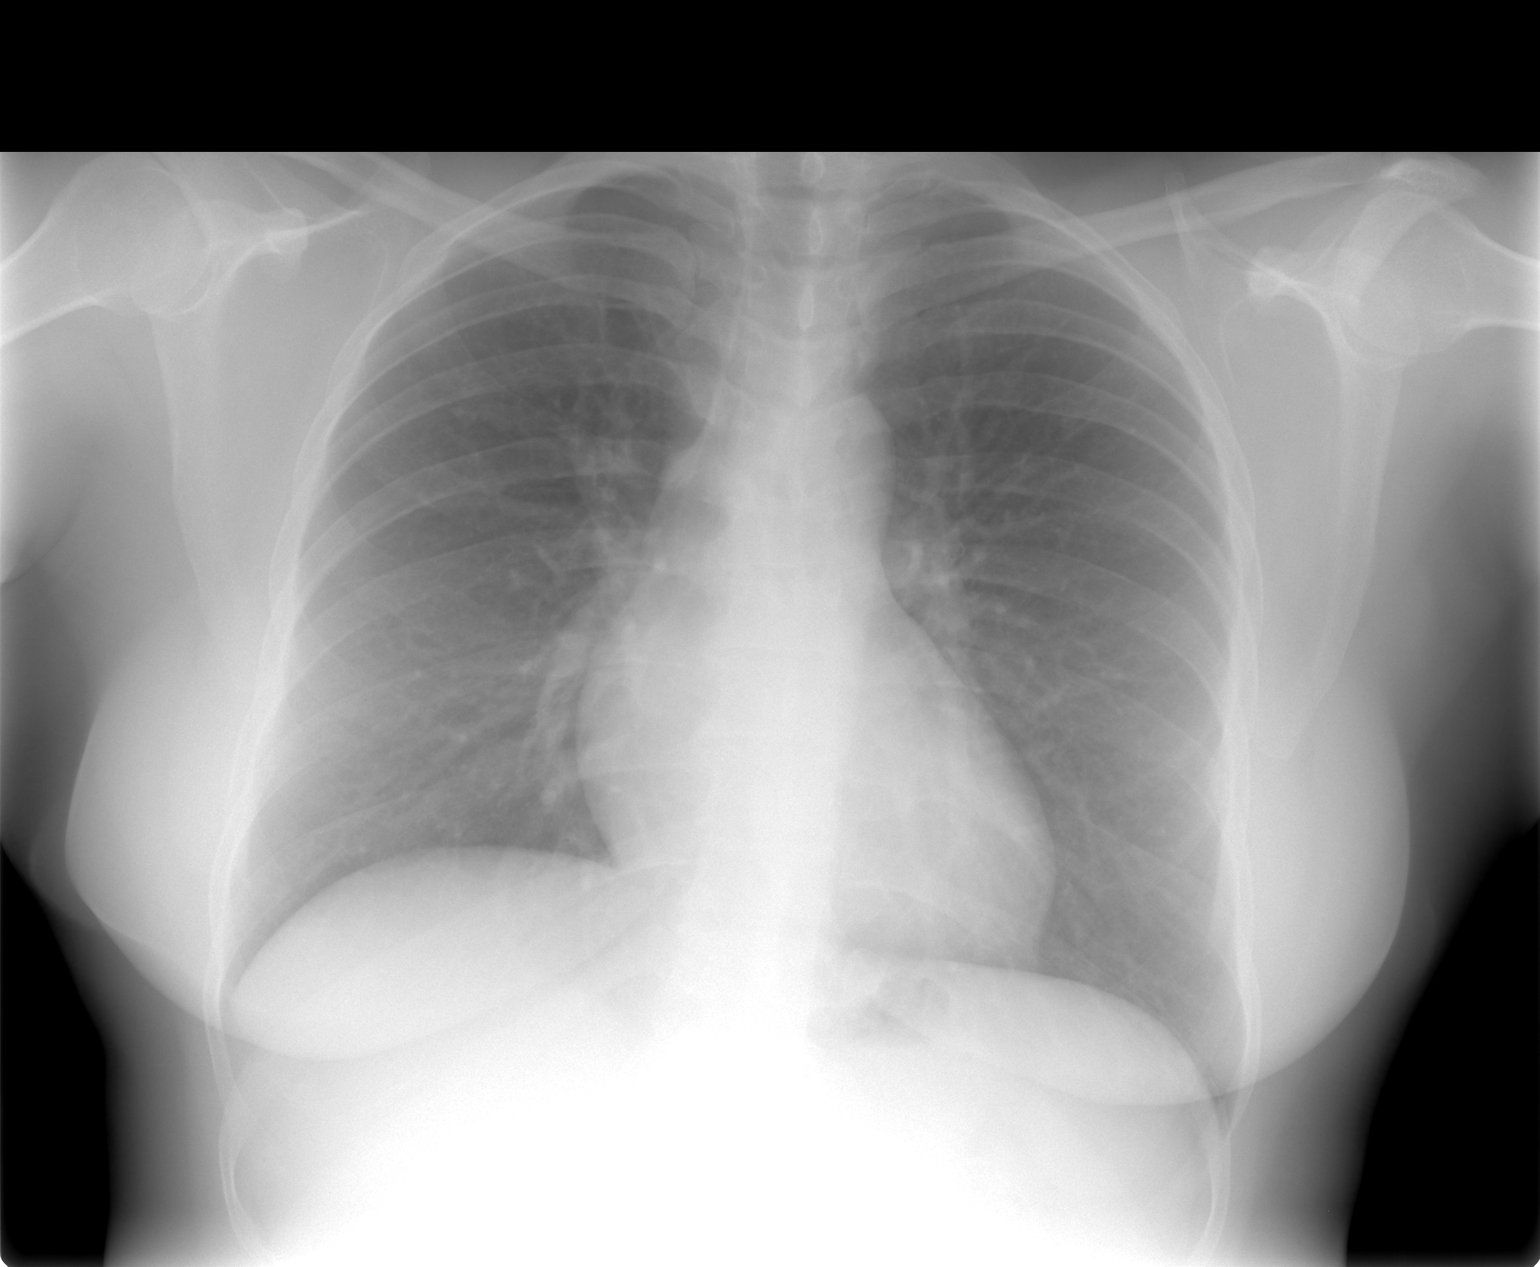

[view not recorded (2 of 2)]
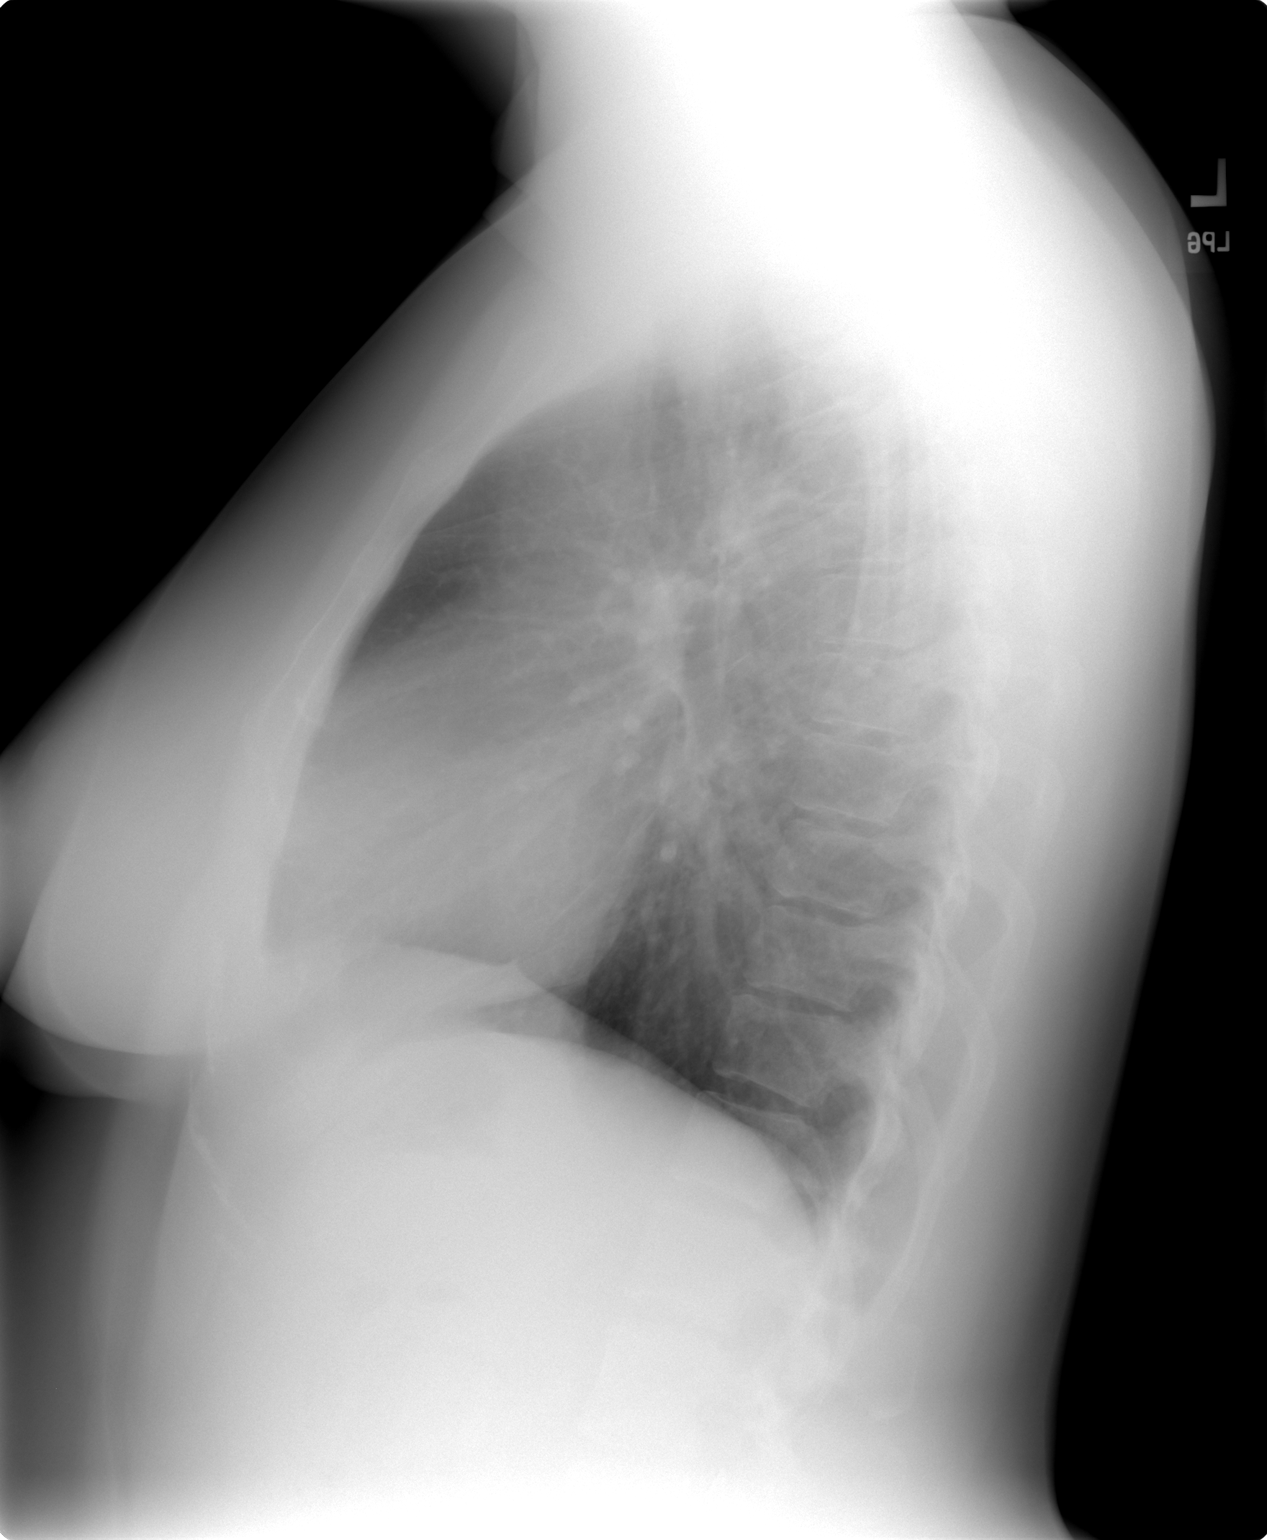

[2 of 2 positions shown; findings below may reference images not displayed]

FINDINGS: The lungs are clear.  Mediastinal contours appear normal.
The heart is within normal limits in size.  No bony abnormality is
seen.
IMPRESSION: No active lung disease.

## 2012-01-16 ENCOUNTER — Telehealth: Payer: Self-pay | Admitting: Family Medicine

## 2012-01-16 DIAGNOSIS — I10 Essential (primary) hypertension: Secondary | ICD-10-CM

## 2012-01-16 NOTE — Telephone Encounter (Signed)
Good questions - yes to both.  I will put in an order for the lab test - come when you want to - call the day before  I can write an Rx for the massage - how should I get it to you?  Congrats on the wt loss and exercsise  Nedra Hai  From: [name]  Sent: Wednesday, January 14, 2012 2:31 PM To: [name] Subject: Questions  Hello Dr. Deirdre Priest,   I hope you are having a great week. I just have 2 questions I would like to ask you. The first is if it's possible to have my potassium checked without having my physical? I don't mind coming in to see you but it hasn't been a year yet. I had to go back to 25 mg of HCTZ because I noticed by bp was elevated when I was only taking 12.5 mg. That could have been because I wasn't working out the same as I had been. I am now working out again and I've lost 7 pounds so far. Along with that I have increased my water intake and I'm drinking about a gallon a day. In the past when I was on Chlorthalidone my potassium was really low and I'm just scared with me drinking so much water that this could happen again.   My second question is if you would be willing to write a prescription for a massage for me (seriously). I have money left over on my [employer] card and massages are covered with a prescription. With the way I'm now working out with weights and the amount of stress I've been under I think this would be great. I'm sure that's not a request you get often. Once again if I need to make an appointment I would be glad to. I apologize for the lengthy email.   Thanks,   [employer]

## 2012-01-21 ENCOUNTER — Other Ambulatory Visit: Payer: 59

## 2012-01-21 DIAGNOSIS — I10 Essential (primary) hypertension: Secondary | ICD-10-CM

## 2012-01-21 DIAGNOSIS — R5381 Other malaise: Secondary | ICD-10-CM

## 2012-01-21 LAB — BASIC METABOLIC PANEL
CO2: 24 mEq/L (ref 19–32)
Chloride: 103 mEq/L (ref 96–112)
Creat: 0.97 mg/dL (ref 0.50–1.10)
Potassium: 3.7 mEq/L (ref 3.5–5.3)
Sodium: 139 mEq/L (ref 135–145)

## 2012-01-21 NOTE — Progress Notes (Signed)
LABS DRAWN FOR BMP AND VITAMIN D CNEWSOME

## 2012-01-21 NOTE — Progress Notes (Signed)
Labs done today Ezra Marquess 

## 2012-01-27 ENCOUNTER — Encounter: Payer: Self-pay | Admitting: Family Medicine

## 2012-01-27 LAB — VITAMIN D 1,25 DIHYDROXY
Vitamin D2 1, 25 (OH)2: <8 pg/mL
Vitamin D3 1, 25 (OH)2: 87 pg/mL

## 2012-03-05 ENCOUNTER — Encounter: Payer: Self-pay | Admitting: Family Medicine

## 2012-03-05 ENCOUNTER — Ambulatory Visit (INDEPENDENT_AMBULATORY_CARE_PROVIDER_SITE_OTHER): Payer: 59 | Admitting: Family Medicine

## 2012-03-05 VITALS — BP 142/83 | HR 77 | Temp 98.2°F | Ht 64.0 in | Wt 193.0 lb

## 2012-03-05 DIAGNOSIS — J069 Acute upper respiratory infection, unspecified: Secondary | ICD-10-CM

## 2012-03-05 NOTE — Progress Notes (Signed)
  Subjective:    Patient ID: Linda Gordon, female    DOB: 1976/04/06, 36 y.o.   MRN: [id number]  HPI Sinus congestion: Patient reports body chills, fatigue, cough, positive sore throat x3 days. Positive ear pain on first illness now resolved. Positive sick contacts at work. No nausea, vomiting, or diarrhea. Feels like there is drainage going down the back of her throat. Also coughing up some yellow mucus. No eye itching. No runny nose. No sneezing. Does have seasonal allergies but does not feel like her symptoms are like allergy symptoms. Eating well. Drinking well.   Review of Systems    as per above. Objective:   Physical Exam  Constitutional: She appears well-developed and well-nourished.  HENT:  Head: Normocephalic and atraumatic.       Mild throat erythema TM wnl bilateral, no redness, no bulging.   Eyes: Pupils are equal, round, and reactive to light. Right eye exhibits no discharge. Left eye exhibits no discharge.  Neck: Normal range of motion.  Cardiovascular: Normal rate and regular rhythm.  Exam reveals no friction rub.   No murmur heard. Pulmonary/Chest: Effort normal. No respiratory distress. She has no wheezes. She has no rales.  Abdominal: Soft. She exhibits no distension.  Musculoskeletal: She exhibits no edema.  Lymphadenopathy:    She has no cervical adenopathy.  Neurological: She is alert.  Skin: No rash noted.  Psychiatric: She has a normal mood and affect. Her behavior is normal.          Assessment & Plan:

## 2012-03-05 NOTE — Patient Instructions (Signed)
I think your symptoms are due to viral syndrome:  Exhaustion and chills: Ibuprofen 600 every 6 hours as needed.   Nasal drainage: Saline nasal spray Afrin as directed- use only 3 days.   Cough: mucinex as directed.  Continue drinking lots of water.   If not resolved in 2 weeks, if persistent high fever, if new or worsening of symptoms return for recheck.

## 2012-03-05 NOTE — Assessment & Plan Note (Signed)
Symptomatic treatment only at this time.  See pt instructions.  Reviewed red flags for return with patient.

## 2012-03-09 ENCOUNTER — Telehealth: Payer: Self-pay | Admitting: Family Medicine

## 2012-03-09 MED ORDER — HYDROCODONE-HOMATROPINE 5-1.5 MG/5ML PO SYRP: 5.0000 mL | ORAL_SOLUTION | Freq: Three times a day (TID) | ORAL | Status: AC | PRN

## 2012-03-09 MED ORDER — BENZONATATE 200 MG PO CAPS: 200.0000 mg | ORAL_CAPSULE | Freq: Two times a day (BID) | ORAL | Status: DC | PRN

## 2012-03-09 NOTE — Telephone Encounter (Signed)
Pt saw Cav[facility] on 15th.  Will forward to her and PCP. Linda Gordon, Linda Gordon

## 2012-03-09 NOTE — Telephone Encounter (Signed)
Patient is calling because she would like something prescribed for the cough that she has for night time.  Please give her a call if this is a problem or to let her know when it is completed.

## 2012-03-09 NOTE — Telephone Encounter (Signed)
Wrote Rx for [employer] and delivered

## 2012-03-09 NOTE — Telephone Encounter (Signed)
I sent in Linda Gordon.   She should come in if not better in a week or so Please let her know  Thanks  LC

## 2012-03-09 NOTE — Telephone Encounter (Signed)
Pt asking to speak with Dr Deirdre Priest - states that Tessalon pearls do not work and needs something to help her at night especially. [facility],

## 2012-03-30 ENCOUNTER — Encounter: Payer: Self-pay | Admitting: Family Medicine

## 2012-03-30 MED ORDER — AZITHROMYCIN 250 MG PO TABS: ORAL_TABLET | ORAL | Status: AC

## 2012-03-30 NOTE — Progress Notes (Unsigned)
Patient ID: Linda Gordon, female   DOB: 1976-12-13, 36 y.o.   MRN: [id number] I do have allergies also, but I know that what I'm experiencing calls for an antibiotic. I am producing the thick yellow phlegm when I cough and thick yellow/green mucus when I blow my nose. The pharmacist here also stated that he thought I needed an antibiotic. I would greatly appreciate it if you would send me a Zpak.   Thanks so much,   Linda Gordon   From: Linda Gordon Sent: Tue 03/30/2012 3:39 PM To: Linda Gordon Subject: RE: Question Do you think it could be allergies?   Tis the season.    If you feel pretty confident it is an infection then I  can send in a zpack.     Let me know  Thanks  Nedra Hai  From: Linda Gordon  Sent: Tuesday, March 30, 2012 10:08 AM To: Linda Gordon Subject: Question  Hello Dr Deirdre Priest,   I hope you're having a great week. I am emailing you because my symptoms have come back as far as the coughing extremely bad, drainage, runny nose etc. I was better for about a week now it's worse and I'm pretty sure I need an antibiotic. When I've had this before, which has been years ago a Zpak always cleared it up. My question to you is do I need to make another visit for this since it's the same issue that I was just seen for?   Thanks,   [employer] [phone]

## 2012-03-31 ENCOUNTER — Other Ambulatory Visit: Payer: Self-pay | Admitting: Family Medicine

## 2012-04-07 ENCOUNTER — Other Ambulatory Visit: Payer: Self-pay | Admitting: Family Medicine

## 2012-04-07 MED ORDER — AZITHROMYCIN 250 MG PO TABS: ORAL_TABLET | ORAL | Status: AC

## 2012-04-26 ENCOUNTER — Other Ambulatory Visit: Payer: Self-pay | Admitting: Family Medicine

## 2012-04-29 ENCOUNTER — Telehealth: Payer: Self-pay | Admitting: Family Medicine

## 2012-04-29 NOTE — Telephone Encounter (Signed)
I think it would be best for you to schedule a visit so I can examine your heart etc, review meds and labs and come up with a good plan.   Please bring all your meds and a list of your recent bp readings as well as your cuff so we can check it.  Thanks  LC  From: [name]  Sent: Tuesday, Apr 27, 2012 11:15 AM To: [name] Subject: Blood pressure  Hello Dr Deirdre Priest,   I hope your week is starting off well. I'm emailing you because I have concerns about my blood pressure. I check my blood pressure at least 3 times a week and for well over a month it has been running high 140's/90's. Due to the passing of my brother from a heart attack, my cholesterol, taking birth control, and being over the age of 81 I know that my risk are high. I'm more than willing to come in for a office visit if you would like me to. My diet has changed significantly and I workout 3-4 days a week so I'm alarmed that my bp is still this high. I am curious if we could try Chlorthalidone again along with the Losartan. Previously when I was taking Chlorthalidone it worked wonders with controlling my blood pressure but it depleted my potassium, but I wasn't taking anything along with it. I'm also open to other options because it's important that this gets under control. Please let me know your thoughts and whether or not I should schedule an office visit.   Thanks in advance,   Scientist, clinical (histocompatibility and immunogenetics)

## 2012-05-03 ENCOUNTER — Encounter: Payer: Self-pay | Admitting: Family Medicine

## 2012-05-03 ENCOUNTER — Ambulatory Visit (INDEPENDENT_AMBULATORY_CARE_PROVIDER_SITE_OTHER): Payer: 59 | Admitting: Family Medicine

## 2012-05-03 VITALS — BP 142/98 | HR 71 | Temp 98.2°F | Ht 64.0 in | Wt 198.0 lb

## 2012-05-03 DIAGNOSIS — I10 Essential (primary) hypertension: Secondary | ICD-10-CM

## 2012-05-03 DIAGNOSIS — F411 Generalized anxiety disorder: Secondary | ICD-10-CM

## 2012-05-03 MED ORDER — CHLORTHALIDONE 25 MG PO TABS: 12.5000 mg | ORAL_TABLET | Freq: Every day | ORAL | Status: DC

## 2012-05-03 NOTE — Patient Instructions (Signed)
Substitute chlorthalidone for hctz Then get bmet in 7 days   Keep a blood pressure diary write down every reading  Email me your blood pressure readings in one week

## 2012-05-04 NOTE — Assessment & Plan Note (Signed)
Stable.  Agree with decreasing her celexa.  Will not precribe xanax that was prescribed by her  GYN.

## 2012-05-04 NOTE — Progress Notes (Signed)
  Subjective:    Patient ID: Linda Gordon, female    DOB: 12-Dec-1976, 36 y.o.   MRN: [id number]  HPI HYPERTENSION Disease Monitoring Home BP Monitoring 150s/90s Chest pain- no     Dyspnea-  no  Medications Compliance: taking as prescribed. Lightheadedness-  no  Edema-  no   ROS - See HPI  PMH Lab Review   Potassium  Date Value Range Status  01/21/2012 3.7  3.5-5.3 (mEq/L) Final     Sodium  Date Value Range Status  01/21/2012 139  135-145 (mEq/L) Final     Anxiety Feels this is improving.  Has cut back on celexa.   Her gyn gave her a few xanax when her brother died which she takes very rarely.  No weight loss No depression,  Exercises regularly   Review of Symptoms - see HPI  PMH - Smoking status noted.       Review of Systems     Objective:   Physical Exam Heart - Regular rate and rhythm.  No murmurs, gallops or rubs.    Lungs:  Normal respiratory effort, chest expands symmetrically. Lungs are clear to auscultation, no crackles or wheezes. Extremities:  No cyanosis, edema, or deformity noted with good range of motion of all major joints.          Assessment & Plan:

## 2012-05-04 NOTE — Assessment & Plan Note (Signed)
Not well controlled.  She used to be well controlled on low dose chrothalidone but stopped because caused hypokalemia.  Was not on acei then.  Will stop hctz and try this.   She will monitor and let me know

## 2012-05-11 ENCOUNTER — Other Ambulatory Visit: Payer: 59

## 2012-05-11 DIAGNOSIS — I10 Essential (primary) hypertension: Secondary | ICD-10-CM

## 2012-05-11 LAB — BASIC METABOLIC PANEL
CO2: 26 mEq/L (ref 19–32)
Calcium: 9.3 mg/dL (ref 8.4–10.5)
Creat: 1.01 mg/dL (ref 0.50–1.10)

## 2012-05-11 NOTE — Progress Notes (Signed)
BMP DONE TODAY Linda Gordon 

## 2012-05-12 ENCOUNTER — Telehealth: Payer: Self-pay | Admitting: Family Medicine

## 2012-05-12 ENCOUNTER — Encounter: Payer: Self-pay | Admitting: Family Medicine

## 2012-05-12 NOTE — Telephone Encounter (Signed)
Those bps do look good.  Your potassium is only slightly low.   Doubt it is worth doing much about although a little extra bananas or OJ wouldn't hurt.  Could also take a 10 meq OTC potassium tablet a day or I could Rx one.  Should recheck in a few months     BASIC METABOLIC PANEL      Component Value Range   Sodium 139  135 - 145 (mEq/L)   Potassium 3.4 (*) 3.5 - 5.3 (mEq/L)   Chloride 103  96 - 112 (mEq/L)   CO2 26  19 - 32 (mEq/L)   Glucose, Bld 62 (*) 70 - 99 (mg/dL)   BUN 19  6 - 23 (mg/dL)   Creat 6.01  0.93 - 2.35 (mg/dL)   Calcium 9.3  8.4 - 57.3 (mg/dL)   Narrative:    Performed at:  [facility]                [address]                [place], [place] [address]     From: [name]  Sent: Tuesday, May 11, 2012 12:23 PM To: [name] Subject: Bp readings  Hello Dr. Deirdre Priest,   I hope you are having a great week!!!   I am scheduled to have my lab done today. I think my blood pressure has been much better since changing to Chlorthalidone, my fingers are crossed that my potassium level has not changed for the worse. My readings are as follows:   5-15 139/90 5-16 124/84 5-17 130/82 5-20 129/88 5-21 133/87   Thanks,   [employer]

## 2012-05-13 ENCOUNTER — Other Ambulatory Visit: Payer: Self-pay | Admitting: Family Medicine

## 2012-05-13 DIAGNOSIS — I10 Essential (primary) hypertension: Secondary | ICD-10-CM

## 2012-05-13 MED ORDER — POTASSIUM CHLORIDE ER 10 MEQ PO TBCR: 10.0000 meq | EXTENDED_RELEASE_TABLET | Freq: Every day | ORAL | Status: DC

## 2012-06-09 ENCOUNTER — Other Ambulatory Visit: Payer: 59

## 2012-06-09 DIAGNOSIS — I10 Essential (primary) hypertension: Secondary | ICD-10-CM

## 2012-06-09 LAB — BASIC METABOLIC PANEL
Chloride: 106 mEq/L (ref 96–112)
Creat: 0.93 mg/dL (ref 0.50–1.10)

## 2012-06-09 NOTE — Progress Notes (Signed)
Bmp done today Harce Volden 

## 2012-06-10 ENCOUNTER — Encounter: Payer: Self-pay | Admitting: Family Medicine

## 2012-09-07 ENCOUNTER — Encounter: Payer: Self-pay | Admitting: Family Medicine

## 2012-09-17 ENCOUNTER — Encounter: Payer: Self-pay | Admitting: Family Medicine

## 2013-02-05 ENCOUNTER — Other Ambulatory Visit: Payer: Self-pay

## 2013-10-19 ENCOUNTER — Encounter: Payer: Self-pay | Admitting: Home Health Services

## 2013-10-20 ENCOUNTER — Other Ambulatory Visit (HOSPITAL_COMMUNITY)
Admission: RE | Admit: 2013-10-20 | Discharge: 2013-10-20 | Disposition: A | Discharge status: Home / Self Care | Payer: 59 | Source: Ambulatory Visit | Attending: Family Medicine | Admitting: Family Medicine

## 2013-10-20 ENCOUNTER — Other Ambulatory Visit: Payer: Self-pay | Admitting: Family Medicine

## 2013-10-20 DIAGNOSIS — Z0000 Encounter for general adult medical examination without abnormal findings: Secondary | ICD-10-CM | POA: Insufficient documentation

## 2013-10-27 ENCOUNTER — Other Ambulatory Visit: Payer: Self-pay

## 2015-01-26 ENCOUNTER — Encounter: Payer: Self-pay | Admitting: Family Medicine

## 2015-02-04 NOTE — Progress Notes (Signed)
This encounter was created in error - please disregard.

## 2015-03-02 ENCOUNTER — Encounter: Payer: Self-pay | Admitting: Family Medicine

## 2016-01-07 MED FILL — LEVONOR-ETH ESTRAD 0.1-0.02: 0.1-20 | 84 days supply | Qty: 112 | Fill #2

## 2016-01-23 MED FILL — SPIRONOLACTONE 25 MG TABLET: 25 | 90 days supply | Qty: 90 | Fill #1

## 2016-01-23 MED FILL — AMLODIPINE BESYLATE 5 MG TA: 5 | 90 days supply | Qty: 90 | Fill #1

## 2016-02-04 MED FILL — FLUTICASONE PROP 50 MCG SPR: 50 | 30 days supply | Qty: 16 | Fill #0

## 2016-02-04 MED FILL — AZITHROMYCIN 250 MG TABLET: 250 | 5 days supply | Qty: 6 | Fill #0

## 2016-03-05 MED FILL — ESCITALOPRAM 10 MG TABLET: 10 | 90 days supply | Qty: 90 | Fill #0

## 2016-04-07 MED FILL — LEVONOR-ETH ESTRAD 0.1-0.02: 0.1-20 | 84 days supply | Qty: 112 | Fill #0

## 2016-04-11 MED FILL — RELPAX 40 MG TABLET: 40 | 30 days supply | Qty: 9 | Fill #0

## 2016-05-06 MED FILL — AMLODIPINE BESYLATE 5 MG TA: 5 | 90 days supply | Qty: 90 | Fill #0

## 2016-05-06 MED FILL — SPIRONOLACTONE 25 MG TABLET: 25 | 90 days supply | Qty: 90 | Fill #0

## 2016-06-19 MED FILL — ESCITALOPRAM 10 MG TABLET: 10 | 90 days supply | Qty: 90 | Fill #1

## 2016-07-07 MED FILL — LEVONOR-ETH ESTRAD 0.1-0.02: 0.1-20 | 84 days supply | Qty: 112 | Fill #0

## 2016-07-14 MED FILL — RELPAX 40 MG TABLET: 40 | 30 days supply | Qty: 9 | Fill #1

## 2016-07-17 ENCOUNTER — Other Ambulatory Visit: Payer: Self-pay | Admitting: Family Medicine

## 2016-07-17 ENCOUNTER — Other Ambulatory Visit (HOSPITAL_COMMUNITY)
Admission: RE | Admit: 2016-07-17 | Discharge: 2016-07-17 | Disposition: A | Discharge status: Home / Self Care | Payer: 59 | Source: Ambulatory Visit | Attending: Family Medicine | Admitting: Family Medicine

## 2016-07-17 DIAGNOSIS — I10 Essential (primary) hypertension: Secondary | ICD-10-CM | POA: Diagnosis not present

## 2016-07-17 DIAGNOSIS — E7800 Pure hypercholesterolemia, unspecified: Secondary | ICD-10-CM | POA: Diagnosis not present

## 2016-07-17 DIAGNOSIS — Z1151 Encounter for screening for human papillomavirus (HPV): Secondary | ICD-10-CM | POA: Insufficient documentation

## 2016-07-17 DIAGNOSIS — Z0000 Encounter for general adult medical examination without abnormal findings: Secondary | ICD-10-CM | POA: Diagnosis not present

## 2016-07-17 DIAGNOSIS — Z124 Encounter for screening for malignant neoplasm of cervix: Secondary | ICD-10-CM | POA: Diagnosis not present

## 2016-07-17 DIAGNOSIS — F411 Generalized anxiety disorder: Secondary | ICD-10-CM | POA: Diagnosis not present

## 2016-07-17 DIAGNOSIS — Z113 Encounter for screening for infections with a predominantly sexual mode of transmission: Secondary | ICD-10-CM | POA: Insufficient documentation

## 2016-07-17 DIAGNOSIS — Z1389 Encounter for screening for other disorder: Secondary | ICD-10-CM | POA: Diagnosis not present

## 2016-07-21 LAB — CYTOLOGY - PAP

## 2016-07-25 DIAGNOSIS — Z113 Encounter for screening for infections with a predominantly sexual mode of transmission: Secondary | ICD-10-CM | POA: Diagnosis not present

## 2016-07-25 DIAGNOSIS — Z0000 Encounter for general adult medical examination without abnormal findings: Secondary | ICD-10-CM | POA: Diagnosis not present

## 2016-07-25 DIAGNOSIS — Z124 Encounter for screening for malignant neoplasm of cervix: Secondary | ICD-10-CM | POA: Diagnosis not present

## 2016-07-25 DIAGNOSIS — E7800 Pure hypercholesterolemia, unspecified: Secondary | ICD-10-CM | POA: Diagnosis not present

## 2016-07-25 DIAGNOSIS — I10 Essential (primary) hypertension: Secondary | ICD-10-CM | POA: Diagnosis not present

## 2016-07-25 DIAGNOSIS — F411 Generalized anxiety disorder: Secondary | ICD-10-CM | POA: Diagnosis not present

## 2016-07-30 MED FILL — ATORVASTATIN 20 MG TABLET: 20 | 90 days supply | Qty: 90 | Fill #0

## 2016-08-27 MED FILL — ESCITALOPRAM 20 MG TABLET: 20 | 90 days supply | Qty: 90 | Fill #0

## 2016-08-27 MED FILL — SPIRONOLACTONE 25 MG TABLET: 25 | 90 days supply | Qty: 90 | Fill #1

## 2016-08-27 MED FILL — AMLODIPINE BESYLATE 5 MG TA: 5 | 90 days supply | Qty: 90 | Fill #0

## 2016-09-16 MED FILL — LEVONOR-ETH ESTRAD 0.1-0.02: 0.1-20 | 84 days supply | Qty: 112 | Fill #0

## 2016-11-07 MED FILL — ELETRIPTAN HBR 40 MG TABLET: 40 | 20 days supply | Qty: 6 | Fill #2

## 2016-11-28 MED FILL — ATORVASTATIN 20 MG TABLET: 20 | 90 days supply | Qty: 90 | Fill #0

## 2016-12-02 MED FILL — SPIRONOLACTONE 25 MG TABLET: 25 | 90 days supply | Qty: 90 | Fill #0

## 2016-12-04 DIAGNOSIS — H5213 Myopia, bilateral: Secondary | ICD-10-CM | POA: Diagnosis not present

## 2016-12-18 MED FILL — ESCITALOPRAM 20 MG TABLET: 20 | 90 days supply | Qty: 90 | Fill #1

## 2016-12-18 MED FILL — ELETRIPTAN HBR 40 MG TABLET: 40 | 90 days supply | Qty: 24 | Fill #0

## 2016-12-18 MED FILL — AMLODIPINE BESYLATE 5 MG TA: 5 | 90 days supply | Qty: 90 | Fill #1

## 2016-12-18 MED FILL — LEVONOR-ETH ESTRAD 0.1-0.02: 0.1-20 | 84 days supply | Qty: 112 | Fill #1

## 2017-03-10 MED FILL — LEVONOR-ETH ESTRAD 0.1-0.02: 0.1-20 | 84 days supply | Qty: 112 | Fill #2

## 2017-03-10 MED FILL — SPIRONOLACTONE 25 MG TABLET: 25 | 90 days supply | Qty: 90 | Fill #1

## 2017-03-11 MED FILL — AMLODIPINE BESYLATE 5 MG TA: 5 | 90 days supply | Qty: 90 | Fill #0

## 2017-03-11 MED FILL — ESCITALOPRAM 20 MG TABLET: 20 | 90 days supply | Qty: 90 | Fill #0

## 2017-03-11 MED FILL — ATORVASTATIN 20 MG TABLET: 20 | 90 days supply | Qty: 90 | Fill #0

## 2017-04-16 ENCOUNTER — Other Ambulatory Visit: Payer: Self-pay | Admitting: Nurse Practitioner

## 2017-04-16 DIAGNOSIS — I10 Essential (primary) hypertension: Secondary | ICD-10-CM | POA: Diagnosis not present

## 2017-04-16 DIAGNOSIS — Z1231 Encounter for screening mammogram for malignant neoplasm of breast: Secondary | ICD-10-CM

## 2017-04-16 DIAGNOSIS — E7800 Pure hypercholesterolemia, unspecified: Secondary | ICD-10-CM | POA: Diagnosis not present

## 2017-04-16 DIAGNOSIS — F411 Generalized anxiety disorder: Secondary | ICD-10-CM | POA: Diagnosis not present

## 2017-04-16 LAB — LIPID PANEL
CHOLESTEROL: 246 — AB (ref 0–200)
HDL: 174 — AB (ref 35–70)
LDL Cholesterol: 152
Triglycerides: 113 (ref 40–160)

## 2017-04-16 LAB — BASIC METABOLIC PANEL
BUN: 17 (ref 4–21)
CREATININE: 0.9 (ref <=1.1)
Glucose: 83
POTASSIUM: 4.5 (ref 3.4–5.3)
SODIUM: 139 (ref 137–147)

## 2017-04-16 LAB — HEPATIC FUNCTION PANEL
ALT: 12 (ref 7–35)
AST: 17 (ref 13–35)

## 2017-04-16 LAB — CBC AND DIFFERENTIAL: WBC: 5.9

## 2017-04-17 MED FILL — VENLAFAXINE HCL ER 37.5 MG: 37.5 | 14 days supply | Qty: 14 | Fill #0

## 2017-04-20 MED FILL — VENLAFAXINE HCL ER 75 MG CA: 75 | 90 days supply | Qty: 90 | Fill #0

## 2017-05-08 ENCOUNTER — Encounter: Payer: Self-pay | Admitting: Radiology

## 2017-05-08 ENCOUNTER — Ambulatory Visit
Admission: RE | Admit: 2017-05-08 | Discharge: 2017-05-08 | Disposition: A | Discharge status: Home / Self Care | Payer: 59 | Source: Ambulatory Visit | Attending: Nurse Practitioner | Admitting: Nurse Practitioner

## 2017-05-08 DIAGNOSIS — Z1231 Encounter for screening mammogram for malignant neoplasm of breast: Secondary | ICD-10-CM | POA: Diagnosis not present

## 2017-05-25 MED FILL — LEVONOR-ETH ESTRAD 0.1-0.02: 0.1-20 | 84 days supply | Qty: 112 | Fill #3

## 2017-07-20 MED FILL — VENLAFAXINE HCL ER 75 MG CA: 75 | 90 days supply | Qty: 90 | Fill #1

## 2017-07-21 MED FILL — AMLODIPINE BESYLATE 5 MG TA: 5 | 90 days supply | Qty: 90 | Fill #0

## 2017-07-21 MED FILL — SPIRONOLACTONE 25 MG TABLET: 25 | 90 days supply | Qty: 90 | Fill #0

## 2017-09-21 MED FILL — LEVONOR-ETH ESTRAD 0.1-0.02: 0.1-20 | 84 days supply | Qty: 112 | Fill #0

## 2017-10-07 ENCOUNTER — Ambulatory Visit (INDEPENDENT_AMBULATORY_CARE_PROVIDER_SITE_OTHER): Payer: 59 | Admitting: Family Medicine

## 2017-10-07 ENCOUNTER — Encounter: Payer: Self-pay | Admitting: Family Medicine

## 2017-10-07 VITALS — BP 120/82 | HR 84 | Temp 98.7°F | Ht 64.75 in | Wt 192.1 lb

## 2017-10-07 DIAGNOSIS — Z8669 Personal history of other diseases of the nervous system and sense organs: Secondary | ICD-10-CM | POA: Diagnosis not present

## 2017-10-07 DIAGNOSIS — F419 Anxiety disorder, unspecified: Secondary | ICD-10-CM

## 2017-10-07 DIAGNOSIS — Z114 Encounter for screening for human immunodeficiency virus [HIV]: Secondary | ICD-10-CM

## 2017-10-07 DIAGNOSIS — Z113 Encounter for screening for infections with a predominantly sexual mode of transmission: Secondary | ICD-10-CM

## 2017-10-07 DIAGNOSIS — Z7689 Persons encountering health services in other specified circumstances: Secondary | ICD-10-CM

## 2017-10-07 DIAGNOSIS — J302 Other seasonal allergic rhinitis: Secondary | ICD-10-CM | POA: Diagnosis not present

## 2017-10-07 DIAGNOSIS — E782 Mixed hyperlipidemia: Secondary | ICD-10-CM

## 2017-10-07 DIAGNOSIS — I10 Essential (primary) hypertension: Secondary | ICD-10-CM

## 2017-10-07 LAB — LIPID PANEL
CHOL/HDL RATIO: 3
Cholesterol: 240 mg/dL — ABNORMAL HIGH (ref 0–200)
HDL: 78.6 mg/dL (ref >39.00)
LDL CALC: 146 mg/dL — AB (ref 0–99)
NONHDL: 161.83
Triglycerides: 79 mg/dL (ref 0.0–149.0)
VLDL: 15.8 mg/dL (ref 0.0–40.0)

## 2017-10-07 MED ORDER — CETIRIZINE HCL 10 MG PO TABS: 10.0000 mg | ORAL_TABLET | Freq: Every day | ORAL | 1 refills | Status: AC

## 2017-10-07 MED ORDER — VENLAFAXINE HCL ER 75 MG PO CP24: 75.0000 mg | ORAL_CAPSULE | Freq: Every day | ORAL | 1 refills | Status: DC

## 2017-10-07 MED ORDER — LEVONORGESTREL-ETHINYL ESTRAD 0.1-20 MG-MCG PO TABS: 1.0000 | ORAL_TABLET | Freq: Every day | ORAL | 4 refills | Status: DC

## 2017-10-07 MED ORDER — SPIRONOLACTONE 25 MG PO TABS: 25.0000 mg | ORAL_TABLET | Freq: Every day | ORAL | 1 refills | Status: DC

## 2017-10-07 MED ORDER — ATORVASTATIN CALCIUM 20 MG PO TABS: 20.0000 mg | ORAL_TABLET | Freq: Every day | ORAL | 2 refills | Status: AC

## 2017-10-07 MED ORDER — AMLODIPINE BESYLATE 10 MG PO TABS: 10.0000 mg | ORAL_TABLET | Freq: Every day | ORAL | 3 refills | Status: DC

## 2017-10-07 MED ORDER — ELETRIPTAN HYDROBROMIDE 40 MG PO TABS: ORAL_TABLET | ORAL | 3 refills | Status: AC

## 2017-10-07 MED FILL — ATORVASTATIN 20 MG TABLET: 20 | 90 days supply | Qty: 90 | Fill #0

## 2017-10-07 NOTE — Progress Notes (Signed)
Patient presents to clinic today to establish care.  SUBJECTIVE: PMH: Pt is a 41 yo AAF with pmh sig for anxiety, HTN, HLD, Sickle cell trait, and migraines.  She was formerly seen by Sherre Poot, NP at [facility] office.  HTN: -pt has been on numerous bp meds in the past.   -Dx'd 18 yrs ago during pregnancy.  Was preeclamptic, but states was not on Mag.  Bp noted to be worse after delivery. -Had hypokalemia with chlorthalidone. -Now on Norvasc 5 mg and Spironolactone 50 mg. -Pt endorses increasing her Norvasc to 10 mg as she noted bp at home was elevated and she had HAs. -Since the Norvasc increase 2 wks ago, pt notes improved bp.  120s/80s and no HAs. -denies blurred vision, CP, SOB, neck pain.  HLD: -on Atorvastatin 20 mg -Pt's brother (desc in 27s 2/2 MI) and mother have HLD.  -Pt has been trying to eat better. -She exercises 3-4x/wk.  Recently lost 6lbs  Mirgraines: -h/o since teens -currently using Relpax prn.   -noted an a/w menses, taking OCPs continuously x last 8 yrs -Now rarely has a HA.  Allergies: Fixed rxn to - amoxicillin, penicillin, fluconazole, sulfa, metronidazole All cause mouth and right eye edema, painful sores, and itching.  Past surgical history: Breast reduction 2007  Social Hx: Pt is a Associate Professor at [employer].  She is single.  She has an 78 yo daughter who is in college at [employer].  Pt denies tobacco use and drug use.  Endorses occasional EtOH use.  LMP 10/3-10/7.  Family Hx: Mom- Alive, DM, HLD, HTN, MI Sister, Gena-HLD, HTN Brother-desc, drug abuse, MI, HLD, HTN, CVA MGM-arthritis, lung Ca with mets to brain, DM, HLD, HTN  Health Maintenance: Dental --[facility] --[facility] Immunizations -- influenza 2018, TB test 2014, tetanus 2018 Colonoscopy --n/a Mammogram --2018 (nml) PAP -- 2017 Bone Density -- n/a   Past Medical History:  Diagnosis Date  . Hyperlipidemia   . Hypertension      Past Surgical History:  Procedure Laterality Date  . REDUCTION MAMMAPLASTY Bilateral 2007    Current Outpatient Prescriptions on File Prior to Visit  Medication Sig Dispense Refill  . ALPRAZolam (XANAX) 0.5 MG tablet Take 0.5 mg by mouth at bedtime as needed. rarely     No current facility-administered medications on file prior to visit.     Allergies  Allergen Reactions  . Amoxicillin   . Fluconazole   . Metronidazole   . Penicillins   . Sulfonamide Derivatives     Family History  Problem Relation Age of Onset  . Hypertension Mother   . Hyperlipidemia Mother   . Stroke Mother   . Diabetes Mother   . Hypertension Sister   . Hypertension Brother   . Diabetes Brother   . Hyperlipidemia Brother   . Heart attack Brother   . Coronary artery disease Brother        Died at age 39    Social History   Social History  . Marital status: Single    Spouse name: N/A  . Number of children: N/A  . Years of education: N/A   Occupational History  . Not on file.   Social History Main Topics  . Smoking status: Never Smoker  . Smokeless tobacco: Never Used  . Alcohol use 0.0 oz/week     Comment: occasional  . Drug use: No  . Sexual activity: Not on file   Other  Topics Concern  . Not on file   Social History Narrative   Lives with mother and daughter (2001).  Works at [employer].    Plans to run [employer] in 2012          ROS General: Denies fever, chills, night sweats, changes in weight, changes in appetite HEENT: Denies headaches, ear pain, changes in vision, rhinorrhea, sore throat CV: Denies CP, palpitations, SOB, orthopnea Pulm: Denies SOB, cough, wheezing GI: Denies abdominal pain, nausea, vomiting, diarrhea, constipation GU: Denies dysuria, hematuria, frequency, vaginal discharge Msk: Denies muscle cramps, joint pains Neuro: Denies weakness, numbness, tingling Skin: Denies rashes, bruising Psych: Denies depression, anxiety,  hallucinations  BP 120/82 (BP Location: Right Arm, Patient Position: Sitting, Cuff Size: Normal)   Pulse 84   Temp 98.7 F (37.1 C) (Oral)   Ht 5' 4.75" (1.645 m)   Wt 192 lb 1.6 oz (87.1 kg)   LMP 09/23/2017 (Exact Date)   BMI 32.21 kg/m   Physical Exam Gen. Pleasant, well developed, well-nourished, in NAD HEENT - [place]/AT, PERRL, no scleral icterus, no nasal drainage, pharynx without erythema or exudate. Neck: No JVD, no thyromegaly Lungs: no use of accessory muscles, CTAB, no wheezes, rales or rhonchi Cardiovascular: RRR, No r/g/m, no peripheral edema Abdomen: BS present, soft, nontender,nondistended Musculoskeletal: No deformities, moves all four extremities, no cyanosis or clubbing, normal tone Neuro:  A&Ox3, CN II-XII intact, normal gait Skin:  Warm, dry, intact, no lesions Psych: normal affect, mood appropriate   No results found for this or any previous visit (from the past 2160 hour(s)).  Assessment/Plan: Essential hypertension  -Controlled -Will increase Norvasc to 10 mg. -Continue BP check at home -Given handout on DASH diet and ways to manage hypertension  -Plan: spironolactone (ALDACTONE) 25 MG tablet, amLODipine (NORVASC) 10 MG tablet -Follow-up in one month for BP  Mixed hyperlipidemia  -Will recheck cholesterol. -Encouraged to increase intake of vegetables and to continue exercising - Plan: Lipid panel, atorvastatin (LIPITOR) 20 MG tablet  Hx of migraines  -Encouraged to avoid triggers, drink plenty of water, get plenty of rest. - Plan: levonorgestrel-ethinyl estradiol (LUTERA) 0.1-20 MG-MCG tablet, venlafaxine XR (EFFEXOR-XR) 75 MG 24 hr capsule, eletriptan (RELPAX) 40 MG tablet  Screening for HIV (human immunodeficiency virus)  -Discussed safe sex practices. - Plan: HIV antibody (with reflex)  Routine screening for STI (sexually transmitted infection)  - Plan: RPR  Anxiety  - Plan: venlafaxine XR (EFFEXOR-XR) 75 MG 24 hr capsule  Seasonal  allergies  - Plan: cetirizine (ZYRTEC) 10 MG tablet  Encounter to establish care -records release form    F/u in 1 month for bp.  Needs to schedule CPE when convenient.

## 2017-10-07 NOTE — Patient Instructions (Addendum)
DASH Eating Plan DASH stands for "Dietary Approaches to Stop Hypertension." The DASH eating plan is a healthy eating plan that has been shown to reduce high blood pressure (hypertension). It may also reduce your risk for type 2 diabetes, heart disease, and stroke. The DASH eating plan may also help with weight loss. What are tips for following this plan? General guidelines  Avoid eating more than 2,300 mg (milligrams) of salt (sodium) a day. If you have hypertension, you may need to reduce your sodium intake to 1,500 mg a day.  Limit alcohol intake to no more than 1 drink a day for nonpregnant women and 2 drinks a day for men. One drink equals 12 oz of beer, 5 oz of wine, or 1 oz of hard liquor.  Work with your health care provider to maintain a healthy body weight or to lose weight. Ask what an ideal weight is for you.  Get at least 30 minutes of exercise that causes your heart to beat faster (aerobic exercise) most days of the week. Activities may include walking, swimming, or biking.  Work with your health care provider or diet and nutrition specialist (dietitian) to adjust your eating plan to your individual calorie needs. Reading food labels  Check food labels for the amount of sodium per serving. Choose foods with less than 5 percent of the Daily Value of sodium. Generally, foods with less than 300 mg of sodium per serving fit into this eating plan.  To find whole grains, look for the word "whole" as the first word in the ingredient list. Shopping  Buy products labeled as "low-sodium" or "no salt added."  Buy fresh foods. Avoid canned foods and premade or frozen meals. Cooking  Avoid adding salt when cooking. Use salt-free seasonings or herbs instead of table salt or sea salt. Check with your health care provider or pharmacist before using salt substitutes.  Do not fry foods. Cook foods using healthy methods such as baking, boiling, grilling, and broiling instead.  Cook with  heart-healthy oils, such as olive, canola, soybean, or sunflower oil. Meal planning   Eat a balanced diet that includes: ? 5 or more servings of fruits and vegetables each day. At each meal, try to fill half of your plate with fruits and vegetables. ? Up to 6-8 servings of whole grains each day. ? Less than 6 oz of lean meat, poultry, or fish each day. A 3-oz serving of meat is about the same size as a deck of cards. One egg equals 1 oz. ? 2 servings of low-fat dairy each day. ? A serving of nuts, seeds, or beans 5 times each week. ? Heart-healthy fats. Healthy fats called Omega-3 fatty acids are found in foods such as flaxseeds and coldwater fish, like sardines, salmon, and mackerel.  Limit how much you eat of the following: ? Canned or prepackaged foods. ? Food that is high in trans fat, such as fried foods. ? Food that is high in saturated fat, such as fatty meat. ? Sweets, desserts, sugary drinks, and other foods with added sugar. ? Full-fat dairy products.  Do not salt foods before eating.  Try to eat at least 2 vegetarian meals each week.  Eat more home-cooked food and less restaurant, buffet, and fast food.  When eating at a restaurant, ask that your food be prepared with less salt or no salt, if possible. What foods are recommended? The items listed may not be a complete list. Talk with your dietitian about what   dietary choices are best for you. Grains Whole-grain or whole-wheat bread. Whole-grain or whole-wheat pasta. Brown rice. Oatmeal. Quinoa. Bulgur. Whole-grain and low-sodium cereals. Pita bread. Low-fat, low-sodium crackers. Whole-wheat flour tortillas. Vegetables Fresh or frozen vegetables (raw, steamed, roasted, or grilled). Low-sodium or reduced-sodium tomato and vegetable juice. Low-sodium or reduced-sodium tomato sauce and tomato paste. Low-sodium or reduced-sodium canned vegetables. Fruits All fresh, dried, or frozen fruit. Canned fruit in natural juice (without  added sugar). Meat and other protein foods Skinless chicken or turkey. Ground chicken or turkey. Pork with fat trimmed off. Fish and seafood. Egg whites. Dried beans, peas, or lentils. Unsalted nuts, nut butters, and seeds. Unsalted canned beans. Lean cuts of beef with fat trimmed off. Low-sodium, lean deli meat. Dairy Low-fat (1%) or fat-free (skim) milk. Fat-free, low-fat, or reduced-fat cheeses. Nonfat, low-sodium ricotta or cottage cheese. Low-fat or nonfat yogurt. Low-fat, low-sodium cheese. Fats and oils Soft margarine without trans fats. Vegetable oil. Low-fat, reduced-fat, or light mayonnaise and salad dressings (reduced-sodium). Canola, safflower, olive, soybean, and sunflower oils. Avocado. Seasoning and other foods Herbs. Spices. Seasoning mixes without salt. Unsalted popcorn and pretzels. Fat-free sweets. What foods are not recommended? The items listed may not be a complete list. Talk with your dietitian about what dietary choices are best for you. Grains Baked goods made with fat, such as croissants, muffins, or some breads. Dry pasta or rice meal packs. Vegetables Creamed or fried vegetables. Vegetables in a cheese sauce. Regular canned vegetables (not low-sodium or reduced-sodium). Regular canned tomato sauce and paste (not low-sodium or reduced-sodium). Regular tomato and vegetable juice (not low-sodium or reduced-sodium). Pickles. Olives. Fruits Canned fruit in a light or heavy syrup. Fried fruit. Fruit in cream or butter sauce. Meat and other protein foods Fatty cuts of meat. Ribs. Fried meat. Bacon. Sausage. Bologna and other processed lunch meats. Salami. Fatback. Hotdogs. Bratwurst. Salted nuts and seeds. Canned beans with added salt. Canned or smoked fish. Whole eggs or egg yolks. Chicken or turkey with skin. Dairy Whole or 2% milk, cream, and half-and-half. Whole or full-fat cream cheese. Whole-fat or sweetened yogurt. Full-fat cheese. Nondairy creamers. Whipped toppings.  Processed cheese and cheese spreads. Fats and oils Butter. Stick margarine. Lard. Shortening. Ghee. Bacon fat. Tropical oils, such as coconut, palm kernel, or palm oil. Seasoning and other foods Salted popcorn and pretzels. Onion salt, garlic salt, seasoned salt, table salt, and sea salt. Worcestershire sauce. Tartar sauce. Barbecue sauce. Teriyaki sauce. Soy sauce, including reduced-sodium. Steak sauce. Canned and packaged gravies. Fish sauce. Oyster sauce. Cocktail sauce. Horseradish that you find on the shelf. Ketchup. Mustard. Meat flavorings and tenderizers. Bouillon cubes. Hot sauce and Tabasco sauce. Premade or packaged marinades. Premade or packaged taco seasonings. Relishes. Regular salad dressings. Where to find more information:  National Heart, Lung, and Blood Institute: www.nhlbi.nih.gov  American Heart Association: www.heart.org Summary  The DASH eating plan is a healthy eating plan that has been shown to reduce high blood pressure (hypertension). It may also reduce your risk for type 2 diabetes, heart disease, and stroke.  With the DASH eating plan, you should limit salt (sodium) intake to 2,300 mg a day. If you have hypertension, you may need to reduce your sodium intake to 1,500 mg a day.  When on the DASH eating plan, aim to eat more fresh fruits and vegetables, whole grains, lean proteins, low-fat dairy, and heart-healthy fats.  Work with your health care provider or diet and nutrition specialist (dietitian) to adjust your eating plan to your individual   calorie needs. This information is not intended to replace advice given to you by your health care provider. Make sure you discuss any questions you have with your health care provider. Document Released: 11/27/2011 Document Revised: 12/01/2016 Document Reviewed: 12/01/2016 Elsevier Interactive Patient Education  2017 Elsevier Inc.  Hypertension Hypertension is another name for high blood pressure. High blood pressure  forces your heart to work harder to pump blood. This can cause problems over time. There are two numbers in a blood pressure reading. There is a top number (systolic) over a bottom number (diastolic). It is best to have a blood pressure below 120/80. Healthy choices can help lower your blood pressure. You may need medicine to help lower your blood pressure if:  Your blood pressure cannot be lowered with healthy choices.  Your blood pressure is higher than 130/80.  Follow these instructions at home: Eating and drinking  If directed, follow the DASH eating plan. This diet includes: ? Filling half of your plate at each meal with fruits and vegetables. ? Filling one quarter of your plate at each meal with whole grains. Whole grains include whole wheat pasta, brown rice, and whole grain bread. ? Eating or drinking low-fat dairy products, such as skim milk or low-fat yogurt. ? Filling one quarter of your plate at each meal with low-fat (lean) proteins. Low-fat proteins include fish, skinless chicken, eggs, beans, and tofu. ? Avoiding fatty meat, cured and processed meat, or chicken with skin. ? Avoiding premade or processed food.  Eat less than 1,500 mg of salt (sodium) a day.  Limit alcohol use to no more than 1 drink a day for nonpregnant women and 2 drinks a day for men. One drink equals 12 oz of beer, 5 oz of wine, or 1 oz of hard liquor. Lifestyle  Work with your doctor to stay at a healthy weight or to lose weight. Ask your doctor what the best weight is for you.  Get at least 30 minutes of exercise that causes your heart to beat faster (aerobic exercise) most days of the week. This may include walking, swimming, or biking.  Get at least 30 minutes of exercise that strengthens your muscles (resistance exercise) at least 3 days a week. This may include lifting weights or pilates.  Do not use any products that contain nicotine or tobacco. This includes cigarettes and e-cigarettes. If you  need help quitting, ask your doctor.  Check your blood pressure at home as told by your doctor.  Keep all follow-up visits as told by your doctor. This is important. Medicines  Take over-the-counter and prescription medicines only as told by your doctor. Follow directions carefully.  Do not skip doses of blood pressure medicine. The medicine does not work as well if you skip doses. Skipping doses also puts you at risk for problems.  Ask your doctor about side effects or reactions to medicines that you should watch for. Contact a doctor if:  You think you are having a reaction to the medicine you are taking.  You have headaches that keep coming back (recurring).  You feel dizzy.  You have swelling in your ankles.  You have trouble with your vision. Get help right away if:  You get a very bad headache.  You start to feel confused.  You feel weak or numb.  You feel faint.  You get very bad pain in your: ? Chest. ? Belly (abdomen).  You throw up (vomit) more than once.  You have trouble breathing.   Summary  Hypertension is another name for high blood pressure.  Making healthy choices can help lower blood pressure. If your blood pressure cannot be controlled with healthy choices, you may need to take medicine. This information is not intended to replace advice given to you by your health care provider. Make sure you discuss any questions you have with your health care provider. Document Released: 05/26/2008 Document Revised: 11/05/2016 Document Reviewed: 11/05/2016 Elsevier Interactive Patient Education  2018 [employer] Inc.  Managing Your Hypertension Hypertension is commonly called high blood pressure. This is when the force of your blood pressing against the walls of your arteries is too strong. Arteries are blood vessels that carry blood from your heart throughout your body. Hypertension forces the heart to work harder to pump blood, and may cause the arteries to become  narrow or stiff. Having untreated or uncontrolled hypertension can cause heart attack, stroke, kidney disease, and other problems. What are blood pressure readings? A blood pressure reading consists of a higher number over a lower number. Ideally, your blood pressure should be below 120/80. The first ("top") number is called the systolic pressure. It is a measure of the pressure in your arteries as your heart beats. The second ("bottom") number is called the diastolic pressure. It is a measure of the pressure in your arteries as the heart relaxes. What does my blood pressure reading mean? Blood pressure is classified into four stages. Based on your blood pressure reading, your health care provider may use the following stages to determine what type of treatment you need, if any. Systolic pressure and diastolic pressure are measured in a unit called mm Hg. Normal  Systolic pressure: below 120.  Diastolic pressure: below 80. Elevated  Systolic pressure: 120-129.  Diastolic pressure: below 80. Hypertension stage 1  Systolic pressure: 130-139.  Diastolic pressure: 80-89. Hypertension stage 2  Systolic pressure: 140 or above.  Diastolic pressure: 90 or above. What health risks are associated with hypertension? Managing your hypertension is an important responsibility. Uncontrolled hypertension can lead to:  A heart attack.  A stroke.  A weakened blood vessel (aneurysm).  Heart failure.  Kidney damage.  Eye damage.  Metabolic syndrome.  Memory and concentration problems.  What changes can I make to manage my hypertension? Hypertension can be managed by making lifestyle changes and possibly by taking medicines. Your health care provider will help you make a plan to bring your blood pressure within a normal range. Eating and drinking  Eat a diet that is high in fiber and potassium, and low in salt (sodium), added sugar, and fat. An example eating plan is called the DASH  (Dietary Approaches to Stop Hypertension) diet. To eat this way: ? Eat plenty of fresh fruits and vegetables. Try to fill half of your plate at each meal with fruits and vegetables. ? Eat whole grains, such as whole wheat pasta, brown rice, or whole grain bread. Fill about one quarter of your plate with whole grains. ? Eat low-fat diary products. ? Avoid fatty cuts of meat, processed or cured meats, and poultry with skin. Fill about one quarter of your plate with lean proteins such as fish, chicken without skin, beans, eggs, and tofu. ? Avoid premade and processed foods. These tend to be higher in sodium, added sugar, and fat.  Reduce your daily sodium intake. Most people with hypertension should eat less than 1,500 mg of sodium a day.  Limit alcohol intake to no more than 1 drink a day for nonpregnant   women and 2 drinks a day for men. One drink equals 12 oz of beer, 5 oz of wine, or 1 oz of hard liquor. Lifestyle  Work with your health care provider to maintain a healthy body weight, or to lose weight. Ask what an ideal weight is for you.  Get at least 30 minutes of exercise that causes your heart to beat faster (aerobic exercise) most days of the week. Activities may include walking, swimming, or biking.  Include exercise to strengthen your muscles (resistance exercise), such as weight lifting, as part of your weekly exercise routine. Try to do these types of exercises for 30 minutes at least 3 days a week.  Do not use any products that contain nicotine or tobacco, such as cigarettes and e-cigarettes. If you need help quitting, ask your health care provider.  Control any long-term (chronic) conditions you have, such as high cholesterol or diabetes. Monitoring  Monitor your blood pressure at home as told by your health care provider. Your personal target blood pressure may vary depending on your medical conditions, your age, and other factors.  Have your blood pressure checked regularly,  as often as told by your health care provider. Working with your health care provider  Review all the medicines you take with your health care provider because there may be side effects or interactions.  Talk with your health care provider about your diet, exercise habits, and other lifestyle factors that may be contributing to hypertension.  Visit your health care provider regularly. Your health care provider can help you create and adjust your plan for managing hypertension. Will I need medicine to control my blood pressure? Your health care provider may prescribe medicine if lifestyle changes are not enough to get your blood pressure under control, and if:  Your systolic blood pressure is 130 or higher.  Your diastolic blood pressure is 80 or higher.  Take medicines only as told by your health care provider. Follow the directions carefully. Blood pressure medicines must be taken as prescribed. The medicine does not work as well when you skip doses. Skipping doses also puts you at risk for problems. Contact a health care provider if:  You think you are having a reaction to medicines you have taken.  You have repeated (recurrent) headaches.  You feel dizzy.  You have swelling in your ankles.  You have trouble with your vision. Get help right away if:  You develop a severe headache or confusion.  You have unusual weakness or numbness, or you feel faint.  You have severe pain in your chest or abdomen.  You vomit repeatedly.  You have trouble breathing. Summary  Hypertension is when the force of blood pumping through your arteries is too strong. If this condition is not controlled, it may put you at risk for serious complications.  Your personal target blood pressure may vary depending on your medical conditions, your age, and other factors. For most people, a normal blood pressure is less than 120/80.  Hypertension is managed by lifestyle changes, medicines, or both.  Lifestyle changes include weight loss, eating a healthy, low-sodium diet, exercising more, and limiting alcohol. This information is not intended to replace advice given to you by your health care provider. Make sure you discuss any questions you have with your health care provider. Document Released: 09/01/2012 Document Revised: 11/05/2016 Document Reviewed: 11/05/2016 Elsevier Interactive Patient Education  2018 Elsevier Inc.  

## 2017-10-08 ENCOUNTER — Other Ambulatory Visit: Payer: Self-pay | Admitting: Emergency Medicine

## 2017-10-08 DIAGNOSIS — Z8669 Personal history of other diseases of the nervous system and sense organs: Secondary | ICD-10-CM

## 2017-10-08 LAB — RPR: RPR: NONREACTIVE

## 2017-10-08 LAB — HIV ANTIBODY (ROUTINE TESTING W REFLEX): HIV 1&2 Ab, 4th Generation: NONREACTIVE

## 2017-10-08 MED ORDER — LEVONORGESTREL-ETHINYL ESTRAD 0.1-20 MG-MCG PO TABS: ORAL_TABLET | ORAL | 4 refills | Status: AC

## 2017-10-19 ENCOUNTER — Telehealth: Payer: Self-pay | Admitting: Family Medicine

## 2017-10-19 MED FILL — AMLODIPINE BESYLATE 10 MG T: 10 | 90 days supply | Qty: 90 | Fill #0

## 2017-10-19 NOTE — Telephone Encounter (Signed)
Received PA request for Relpax. PA submitted & is pending. Key:  [id number]

## 2017-10-21 ENCOUNTER — Encounter: Payer: Self-pay | Admitting: Family Medicine

## 2017-10-22 MED FILL — ELETRIPTAN HBR 40 MG TABLET: 40 | 30 days supply | Qty: 6 | Fill #0

## 2017-11-09 ENCOUNTER — Ambulatory Visit: Payer: Self-pay | Admitting: Family Medicine

## 2017-11-16 MED FILL — VENLAFAXINE HCL ER 75 MG CA: 75 | 90 days supply | Qty: 90 | Fill #0

## 2017-11-19 MED FILL — SPIRONOLACTONE 25 MG TABLET: 25 | 90 days supply | Qty: 90 | Fill #0

## 2017-12-21 DIAGNOSIS — Z118 Encounter for screening for other infectious and parasitic diseases: Secondary | ICD-10-CM | POA: Diagnosis not present

## 2017-12-21 DIAGNOSIS — Z01419 Encounter for gynecological examination (general) (routine) without abnormal findings: Secondary | ICD-10-CM | POA: Diagnosis not present

## 2017-12-21 DIAGNOSIS — Z1151 Encounter for screening for human papillomavirus (HPV): Secondary | ICD-10-CM | POA: Diagnosis not present

## 2017-12-21 DIAGNOSIS — N898 Other specified noninflammatory disorders of vagina: Secondary | ICD-10-CM | POA: Diagnosis not present

## 2017-12-21 DIAGNOSIS — Z113 Encounter for screening for infections with a predominantly sexual mode of transmission: Secondary | ICD-10-CM | POA: Diagnosis not present

## 2017-12-21 DIAGNOSIS — Z6834 Body mass index (BMI) 34.0-34.9, adult: Secondary | ICD-10-CM | POA: Diagnosis not present

## 2017-12-21 DIAGNOSIS — Z1159 Encounter for screening for other viral diseases: Secondary | ICD-10-CM | POA: Diagnosis not present

## 2017-12-21 DIAGNOSIS — Z114 Encounter for screening for human immunodeficiency virus [HIV]: Secondary | ICD-10-CM | POA: Diagnosis not present

## 2017-12-21 MED FILL — LEVONOR-ETH ESTRAD 0.1-0.02: 0.1-20 | 84 days supply | Qty: 112 | Fill #0

## 2018-01-11 MED FILL — AMLODIPINE BESYLATE 10 MG T: 10 | 90 days supply | Qty: 90 | Fill #1 | Status: TO

## 2018-01-11 MED FILL — ATORVASTATIN 20 MG TABLET: 20 | 90 days supply | Qty: 90 | Fill #1 | Status: TO

## 2018-02-16 MED FILL — VENLAFAXINE HCL ER 75 MG CA: 75 | 90 days supply | Qty: 90 | Fill #1 | Status: TO

## 2018-02-16 MED FILL — SPIRONOLACTONE 25 MG TABLET: 25 | 90 days supply | Qty: 90 | Fill #1 | Status: TO

## 2018-02-27 DIAGNOSIS — H5213 Myopia, bilateral: Secondary | ICD-10-CM | POA: Diagnosis not present

## 2018-02-27 DIAGNOSIS — H52223 Regular astigmatism, bilateral: Secondary | ICD-10-CM | POA: Diagnosis not present

## 2018-02-27 DIAGNOSIS — H524 Presbyopia: Secondary | ICD-10-CM | POA: Diagnosis not present

## 2018-03-03 MED FILL — LEVONOR-ETH ESTRAD 0.1-0.02: 0.1-20 | 84 days supply | Qty: 112 | Fill #1 | Status: TO

## 2018-06-04 ENCOUNTER — Other Ambulatory Visit: Payer: Self-pay | Admitting: Family Medicine

## 2018-06-04 DIAGNOSIS — F419 Anxiety disorder, unspecified: Secondary | ICD-10-CM

## 2018-06-04 DIAGNOSIS — I10 Essential (primary) hypertension: Secondary | ICD-10-CM

## 2018-06-04 DIAGNOSIS — Z8669 Personal history of other diseases of the nervous system and sense organs: Secondary | ICD-10-CM

## 2018-06-04 MED ORDER — AMLODIPINE BESYLATE 10 MG PO TABS: 10.0000 mg | ORAL_TABLET | Freq: Every day | ORAL | 0 refills | Status: AC

## 2018-06-04 NOTE — Telephone Encounter (Signed)
Patient changed pharmacies and requesting refills sent.  Effexor refill Last OV:10/07/17 Last refill:10/07/17 90 tab/1 refill WUJ:[id number]:[facility] [place] [place] [address] [phone] (Phone) [phone] (Fax)

## 2018-06-04 NOTE — Telephone Encounter (Signed)
Copied from CRM [phone]. Topic: General - Other >> Jun 04, 2018  4:15 PM Elliot GaultBell, Tiffany M wrote:  Relation to pt: self Call back number:[phone] Pharmacy[facility]  [facility] [place] - [address] [phone] (Phone) [phone] (Fax)  Reason for call:  Patient changed pharmacy (chart reflects new pharmacy). Patient requesting venlafaxine XR (EFFEXOR-XR) 75 MG 24 hr capsule and amLODipine (NORVASC) 10 MG tablet, informed patient please allow 72 hour turn around time, please advise

## 2018-06-07 MED ORDER — VENLAFAXINE HCL ER 75 MG PO CP24: 75.0000 mg | ORAL_CAPSULE | Freq: Every day | ORAL | 0 refills | Status: AC

## 2018-06-07 NOTE — Telephone Encounter (Signed)
Pt last OV was 10/07/2017 and last refill was same date for 90 tabs with 1 refill , pt is requesting for refills please advise if ok to refill

## 2018-07-09 ENCOUNTER — Telehealth: Payer: Self-pay | Admitting: Family Medicine

## 2018-07-09 DIAGNOSIS — I10 Essential (primary) hypertension: Secondary | ICD-10-CM

## 2018-07-09 NOTE — Telephone Encounter (Signed)
Copied from CRM [id number]. Topic: Quick Communication - Rx Refill/Question >> Jul 09, 2018  4:15 PM Laural BenesJohnson, Louisianahiquita C wrote: Medication: spironolactone (ALDACTONE) 25 MG tablet   Has the patient contacted their pharmacy? No  (Agent: If no, request that the patient contact the pharmacy for the refill.)  (Agent: If yes, when and what did the pharmacy advise?)  Preferred Pharmacy (with phone number or street name): [facility] - [place], [place] - [address]  Agent: Please be advised that RX refills may take up to 3 business days. We ask that you follow-up with your pharmacy.

## 2018-07-13 MED ORDER — SPIRONOLACTONE 25 MG PO TABS
25.0000 mg | ORAL_TABLET | Freq: Every day | ORAL | 0 refills | Status: AC
Start: 2018-07-13 — End: ?
